# Patient Record
Sex: Male | Born: 1960 | Race: White | Hispanic: No | Smoking: Current every day smoker
Health system: Southern US, Community
[De-identification: ages and names within clinical notes are randomized; demographics above are authoritative.]

## PROBLEM LIST (undated history)

## (undated) DIAGNOSIS — N529 Male erectile dysfunction, unspecified: Secondary | ICD-10-CM

## (undated) DIAGNOSIS — D696 Thrombocytopenia, unspecified: Secondary | ICD-10-CM

## (undated) DIAGNOSIS — F411 Generalized anxiety disorder: Secondary | ICD-10-CM

## (undated) DIAGNOSIS — K862 Cyst of pancreas: Principal | ICD-10-CM

## (undated) DIAGNOSIS — I10 Essential (primary) hypertension: Secondary | ICD-10-CM

## (undated) DIAGNOSIS — E785 Hyperlipidemia, unspecified: Secondary | ICD-10-CM

## (undated) DIAGNOSIS — E039 Hypothyroidism, unspecified: Secondary | ICD-10-CM

## (undated) DIAGNOSIS — K859 Acute pancreatitis without necrosis or infection, unspecified: Secondary | ICD-10-CM

## (undated) HISTORY — DX: Hypothyroidism, unspecified: E03.9

## (undated) HISTORY — DX: Thrombocytopenia, unspecified: D69.6

## (undated) HISTORY — DX: Male erectile dysfunction, unspecified: N52.9

## (undated) HISTORY — DX: Cyst of pancreas: K86.2

## (undated) HISTORY — DX: Essential (primary) hypertension: I10

## (undated) HISTORY — DX: Acute pancreatitis without necrosis or infection, unspecified: K85.90

## (undated) HISTORY — DX: Generalized anxiety disorder: F41.1

## (undated) HISTORY — DX: Hyperlipidemia, unspecified: E78.5

## (undated) HISTORY — PX: ESOPHAGOGASTRODUODENOSCOPY: SHX1529

---

## 1996-05-28 HISTORY — PX: OTHER SURGICAL HISTORY: SHX169

## 2004-08-21 ENCOUNTER — Encounter (INDEPENDENT_AMBULATORY_CARE_PROVIDER_SITE_OTHER): Payer: Self-pay | Admitting: *Deleted

## 2004-08-21 ENCOUNTER — Ambulatory Visit (HOSPITAL_COMMUNITY): Admission: RE | Admit: 2004-08-21 | Discharge: 2004-08-21 | Payer: Self-pay | Admitting: Gastroenterology

## 2006-05-28 DIAGNOSIS — K859 Acute pancreatitis without necrosis or infection, unspecified: Secondary | ICD-10-CM

## 2006-05-28 HISTORY — DX: Acute pancreatitis without necrosis or infection, unspecified: K85.90

## 2006-12-23 ENCOUNTER — Inpatient Hospital Stay (HOSPITAL_COMMUNITY): Admission: EM | Admit: 2006-12-23 | Discharge: 2006-12-26 | Payer: Self-pay | Admitting: Emergency Medicine

## 2008-05-07 ENCOUNTER — Ambulatory Visit: Payer: Self-pay | Admitting: Internal Medicine

## 2008-05-07 DIAGNOSIS — F411 Generalized anxiety disorder: Secondary | ICD-10-CM | POA: Insufficient documentation

## 2008-05-07 DIAGNOSIS — E785 Hyperlipidemia, unspecified: Secondary | ICD-10-CM

## 2008-05-07 DIAGNOSIS — E039 Hypothyroidism, unspecified: Secondary | ICD-10-CM | POA: Insufficient documentation

## 2008-05-07 HISTORY — DX: Generalized anxiety disorder: F41.1

## 2008-05-07 HISTORY — DX: Hyperlipidemia, unspecified: E78.5

## 2008-05-07 HISTORY — DX: Hypothyroidism, unspecified: E03.9

## 2008-08-05 ENCOUNTER — Encounter: Admission: RE | Admit: 2008-08-05 | Discharge: 2008-08-05 | Payer: Self-pay | Admitting: Gastroenterology

## 2008-08-09 ENCOUNTER — Ambulatory Visit: Payer: Self-pay | Admitting: Internal Medicine

## 2008-08-10 LAB — CONVERTED CEMR LAB
Alkaline Phosphatase: 60 units/L (ref 39–117)
Basophils Absolute: 0.1 10*3/uL (ref 0.0–0.1)
Bilirubin, Direct: 0.2 mg/dL (ref 0.0–0.3)
CO2: 27 meq/L (ref 19–32)
Calcium: 9.3 mg/dL (ref 8.4–10.5)
GFR calc Af Amer: 116 mL/min
HDL: 23.6 mg/dL — ABNORMAL LOW (ref >39.0)
Hemoglobin, Urine: NEGATIVE
Hemoglobin: 15.2 g/dL (ref 13.0–17.0)
Hgb A1c MFr Bld: 5.1 % (ref 4.6–6.5)
LDL Cholesterol: 96 mg/dL (ref 0–99)
Leukocytes, UA: NEGATIVE
Lymphocytes Relative: 14.9 % (ref 12.0–46.0)
MCHC: 36 g/dL (ref 30.0–36.0)
Monocytes Absolute: 0.3 10*3/uL (ref 0.1–1.0)
Neutro Abs: 4.7 10*3/uL (ref 1.4–7.7)
Nitrite: NEGATIVE
Platelets: 101 10*3/uL — ABNORMAL LOW (ref 150–400)
Potassium: 3.5 meq/L (ref 3.5–5.1)
RDW: 11.5 % (ref 11.5–14.6)
Sodium: 138 meq/L (ref 135–145)
TSH: 2.89 microintl units/mL (ref 0.35–5.50)
Total Bilirubin: 1.1 mg/dL (ref 0.3–1.2)
Total CHOL/HDL Ratio: 6.6
Triglycerides: 181 mg/dL — ABNORMAL HIGH (ref 0–149)
VLDL: 36 mg/dL (ref 0–40)
Vitamin B-12: >1500 pg/mL — ABNORMAL HIGH (ref 211–911)
pH: 6 (ref 5.0–8.0)

## 2008-09-27 ENCOUNTER — Telehealth (INDEPENDENT_AMBULATORY_CARE_PROVIDER_SITE_OTHER): Payer: Self-pay | Admitting: *Deleted

## 2009-02-15 ENCOUNTER — Telehealth: Payer: Self-pay | Admitting: Internal Medicine

## 2009-06-10 ENCOUNTER — Telehealth (INDEPENDENT_AMBULATORY_CARE_PROVIDER_SITE_OTHER): Payer: Self-pay | Admitting: *Deleted

## 2009-11-04 ENCOUNTER — Telehealth: Payer: Self-pay | Admitting: Internal Medicine

## 2009-11-10 ENCOUNTER — Telehealth: Payer: Self-pay | Admitting: Internal Medicine

## 2009-11-25 ENCOUNTER — Ambulatory Visit: Payer: Self-pay | Admitting: Internal Medicine

## 2010-05-28 HISTORY — PX: COLONOSCOPY: SHX174

## 2010-06-27 NOTE — Assessment & Plan Note (Signed)
Summary: FU  D/T  STC   Vital Signs:  Patient profile:   50 year old male Height:      66 inches Weight:      150.50 pounds BMI:     24.38 O2 Sat:      97 % on Room air Temp:     98.4 degrees F oral Pulse rate:   88 / minute BP sitting:   112 / 80  (left arm) Cuff size:   regular  Vitals Entered By: Zella Ball Ewing CMA (AAMA) (November 25, 2009 1:47 PM)  O2 Flow:  Room air  CC: followup/RE   CC:  followup/RE.  History of Present Illness: overall doing well,  no specific complaints;  denies hyper or hypo thyroid symptoms;  Pt denies CP, sob, doe, wheezing, orthopnea, pnd, worsening LE edema, palps, dizziness or syncope  Pt denies new neuro symptoms such as headache, facial or extremity weakness  Still smoking and not really thinking about quitting.    Preventive Screening-Counseling & Management      Drug Use:  no.    Problems Prior to Update: 1)  Preventive Health Care  (ICD-V70.0) 2)  Hyperlipidemia  (ICD-272.4) 3)  Hypothyroidism  (ICD-244.9) 4)  Anxiety  (ICD-300.00)  Medications Prior to Update: 1)  Metoprolol Tartrate 25 Mg Tabs (Metoprolol Tartrate) .... 1/2 -1 By Mouth Daily As Needed 2)  Clonazepam 1 Mg Tabs (Clonazepam) .... 1/2 - 1 By Mouth Two Times A Day As Needed --  Need Return Office Visit For Further Refills 3)  Armour Thyroid 30 Mg Tabs (Thyroid) .Marland Kitchen.. 1 By Mouth Daily in The Am - Needs Office Visit For Further Refills  Current Medications (verified): 1)  Metoprolol Tartrate 25 Mg Tabs (Metoprolol Tartrate) .... 1/2 -1 By Mouth Daily As Needed 2)  Clonazepam 1 Mg Tabs (Clonazepam) .... 1/2 - 1 By Mouth Two Times A Day As Needed 3)  Levothyroxine Sodium 50 Mcg Tabs (Levothyroxine Sodium) .Marland Kitchen.. 1 By Mouth Once Daily  Allergies (verified): 1)  ! 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         all otherwise negative per pt -    Physical Exam  General:  alert and well-developed.   Head:  normocephalic and atraumatic.   Eyes:  vision grossly intact, pupils equal, and pupils round.   Ears:  R ear normal and L ear normal.   Nose:  no external deformity and no nasal discharge.   Mouth:  no gingival abnormalities and pharynx pink and moist.   Neck:  supple and no masses.   Lungs:  normal respiratory effort and normal breath sounds.  Heart:  normal rate and regular rhythm.   Abdomen:  soft, non-tender, and normal bowel sounds.   Msk:  no joint tenderness and no joint swelling.   Extremities:  no edema, no erythema  Neurologic:  cranial nerves II-XII intact and strength normal in all extremities.   Skin:  color normal and no rashes.   Psych:  good eye contact, not anxious appearing, and not depressed appearing.     Impression & Recommendations:  Problem # 1:  Preventive Health Care (ICD-V70.0) Overall doing well, age appropriate education and counseling updated and referral  for appropriate preventive services done unless declined, immunizations up to date or declined, diet counseling done if overweight, urged to quit smoking if smokes , most recent labs reviewed and current ordered if appropriate, ecg reviewed or declined (interpretation per ECG scanned in the EMR if done); information regarding Medicare Prevention requirements given if appropriate; speciality referrals updated as appropriate   Problem # 2:  HYPOTHYROIDISM (ICD-244.9)  His updated medication list for this problem includes:    Levothyroxine Sodium 50 Mcg Tabs (Levothyroxine sodium) .Marland Kitchen... 1 by mouth once daily treat as above, f/u any worsening signs or symptoms , to change meds as armour thyroid is $28/mo  Problem # 3:  HYPERLIPIDEMIA (ICD-272.4)  Labs Reviewed: SGOT: 40 (08/09/2008)   SGPT: 47 (08/09/2008)   HDL:23.6 (08/09/2008)  LDL:96 (08/09/2008)  Chol:156 (08/09/2008)  Trig:181 (08/09/2008) d/w pt - urged to follow low chol diet,  o/w stable overall by hx and exam, ok to continue meds/tx as is   Complete Medication List: 1)  Metoprolol Tartrate 25 Mg Tabs (Metoprolol tartrate) .... 1/2 -1 by mouth daily as needed 2)  Clonazepam 1 Mg Tabs (Clonazepam) .... 1/2 - 1 by mouth two times a day as needed 3)  Levothyroxine Sodium 50 Mcg Tabs (Levothyroxine sodium) .Marland Kitchen.. 1 by mouth once daily  Patient Instructions: 1)  please stop smoking 2)  stop the armour thyroid when the current bottle is done 3)  start the new less expensive thyroid medication after that 4)  return for LAB only oin 2 months:   CPX labs   v70.0 5)  Continue all previous medications as before this visit  6)  Please schedule a follow-up appointment in 1 year or sooner if needed Prescriptions: METOPROLOL TARTRATE 25 MG TABS (METOPROLOL TARTRATE) 1/2 -1 by mouth daily as needed  #90 x 3   Entered and Authorized by:   Corwin Levins MD   Signed by:   Corwin Levins MD on 11/25/2009   Method used:   Print then Give to Patient    RxID:   [id number] CLONAZEPAM 1 MG TABS (CLONAZEPAM) 1/2 - 1 by mouth two times a day as needed  #60 x 2   Entered and Authorized by:   Corwin Levins MD   Signed by:   Corwin Levins MD on 11/25/2009   Method used:   Print then Give to Patient   RxID:   [id number] LEVOTHYROXINE SODIUM 50 MCG TABS (LEVOTHYROXINE SODIUM) 1 by mouth once daily  #90 x 3   Entered and Authorized by:   Corwin Levins MD   Signed by:   Corwin Levins MD on 11/25/2009   Method used:   Print then Give to Patient   RxID:   [id number]

## 2010-06-27 NOTE — Progress Notes (Signed)
Summary: REFILL?  Phone Note Refill Request   Refills Requested: Medication #1:  ARMOUR THYROID 30 MG TABS 1 by mouth daily in the am. Initial call taken by: Lamar Sprinkles, CMA,  November 04, 2009 5:22 PM  Follow-up for Phone Call        ok, but this is last refill   needs OV with CPX labs  - v70.0 Follow-up by: Corwin Levins MD,  November 04, 2009 5:31 PM  Additional Follow-up for Phone Call Additional follow up Details #1::        Pt's wife informed, states pt has appt scheduled for 7/01 for CPX with labs. Rx in cabinet for pt pick up Additional Follow-up by: Margaret Pyle, CMA,  November 07, 2009 9:46 AM    New/Updated Medications: ARMOUR THYROID 30 MG TABS (THYROID) 1 by mouth daily in the am - needs office visit for further refills Prescriptions: ARMOUR THYROID 30 MG TABS (THYROID) 1 by mouth daily in the am - needs office visit for further refills  #30 x 0   Entered and Authorized by:   Corwin Levins MD   Signed by:   Corwin Levins MD on 11/04/2009   Method used:   Print then Give to Patient   RxID:   [id number]

## 2010-06-27 NOTE — Progress Notes (Signed)
Summary: Med.   Phone Note From Pharmacy   Caller: [facility] Summary of Call: [facility] states pt. has been on the compounded Thyroid usp Capsules 30mg , is it ok to stay with that or change to Armour Thyroid? [facility] Phone#[phone] Fax# [phone] Initial call taken by: Scharlene Gloss,  November 10, 2009 1:35 PM  Follow-up for Phone Call        ok to stay on current med - to robin to handle Follow-up by: Corwin Levins MD,  November 10, 2009 1:51 PM  Additional Follow-up for Phone Call Additional follow up Details #1::        Informed pharmacy MD declines change Additional Follow-up by: Scharlene Gloss,  November 10, 2009 2:22 PM

## 2010-06-27 NOTE — Progress Notes (Signed)
Summary: med refill  Phone Note Refill Request  on June 10, 2009 11:48 AM  Refills Requested: Medication #1:  CLONAZEPAM 1 MG TABS 1/2 - 1 by mouth two times a day as needed   Dosage confirmed as above?Dosage Confirmed   Notes: Algis Liming [place] [phone] Initial call taken by: Scharlene Gloss,  June 10, 2009 11:48 AM  Follow-up for Phone Call        done hardcopy to [facility] side B - dahlia  Follow-up by: Corwin Levins MD,  June 10, 2009 1:05 PM  Additional Follow-up for Phone Call Additional follow up Details #1::        Faxed to [phone] Additional Follow-up by: Josph Macho CMA,  June 10, 2009 1:49 PM    New/Updated Medications: CLONAZEPAM 1 MG TABS (CLONAZEPAM) 1/2 - 1 by mouth two times a day as needed --  need return office visit for further refills Prescriptions: CLONAZEPAM 1 MG TABS (CLONAZEPAM) 1/2 - 1 by mouth two times a day as needed --  need return office visit for further refills  #60 x 1   Entered and Authorized by:   Corwin Levins MD   Signed by:   Corwin Levins MD on 06/10/2009   Method used:   Print then Give to Patient   RxID:   [id number]

## 2010-07-08 IMAGING — US US ABDOMEN COMPLETE
1 series · 13 of 25 positions shown · non-contrast
Comparison: [HOSPITAL] abdominal pelvic CT 12/23/2006.

CLINICAL DATA: Generalized abdominal pain, indigestion, elevated
iron levels.  History pancreatitis and fatty liver.

COMPLETE ABDOMINAL ULTRASOUND

[Series 1: us abdomen complete · 0.31mm/px · 13 of 86 slices shown]
[im 1/86]
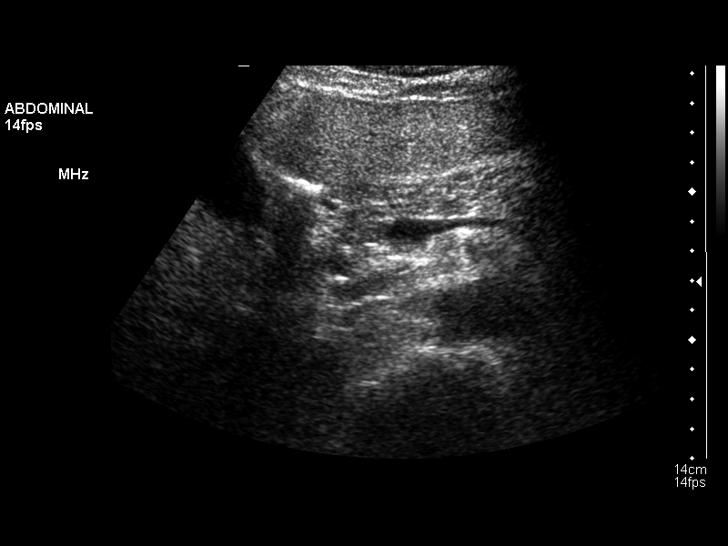
[im 8/86]
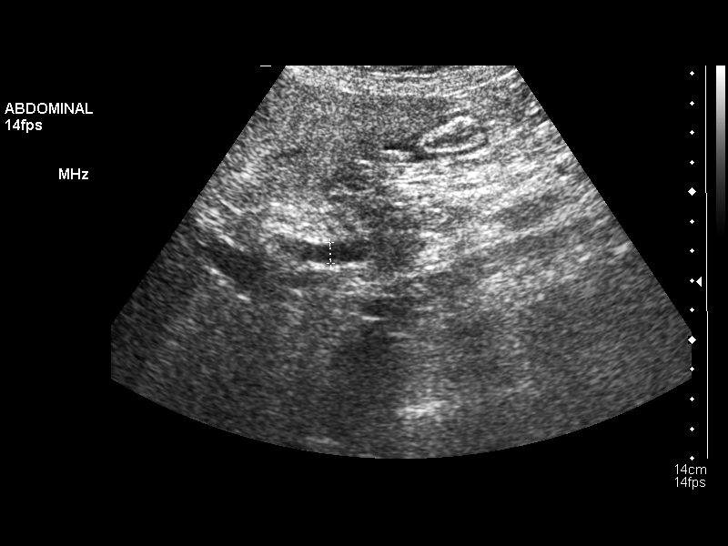
[im 15/86]
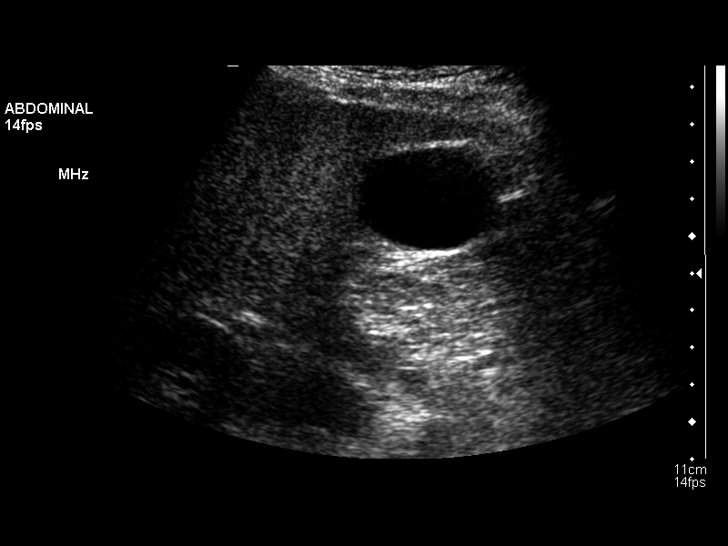
[im 22/86]
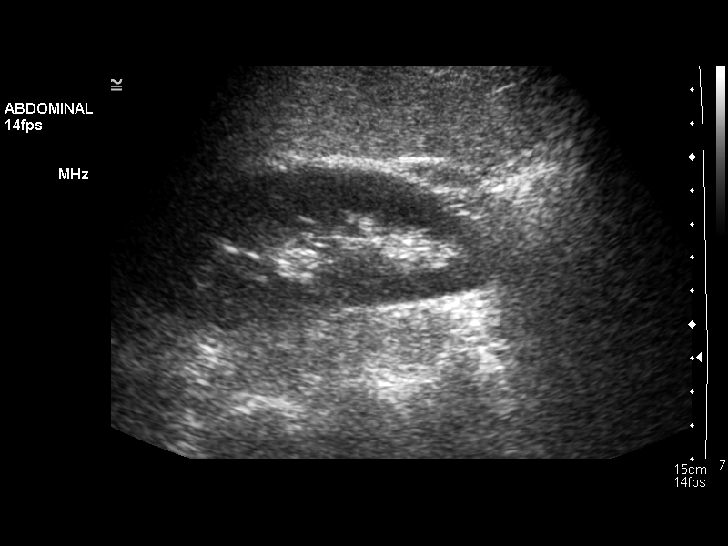
[im 29/86]
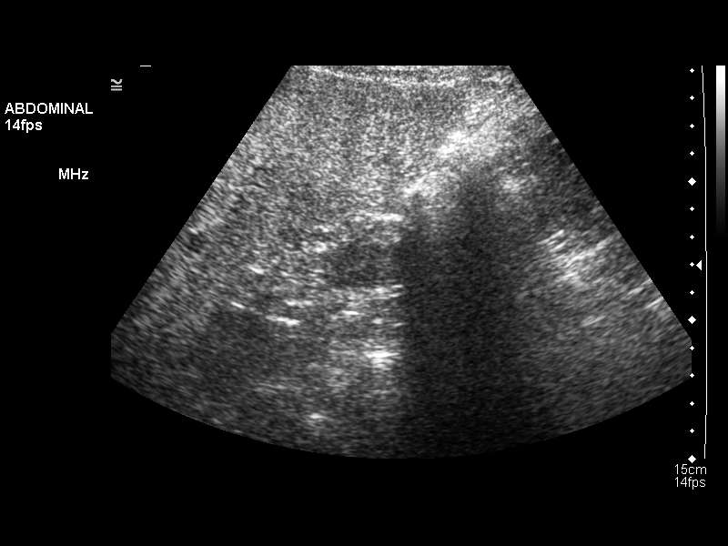
[im 36/86]
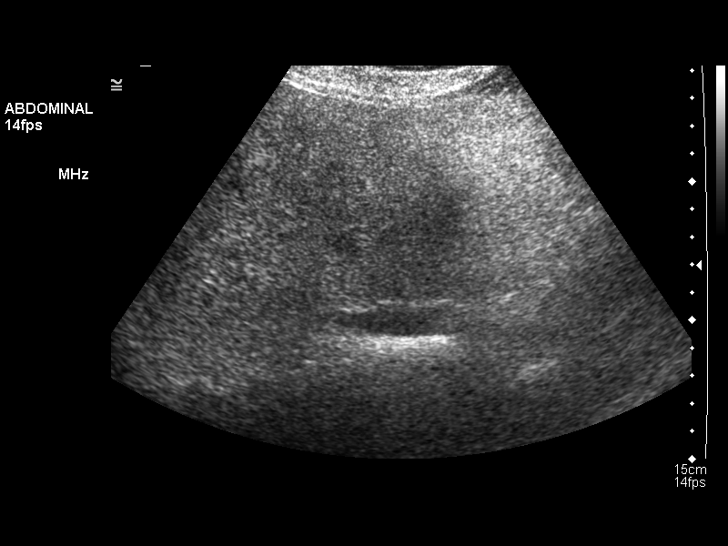
[im 43/86]
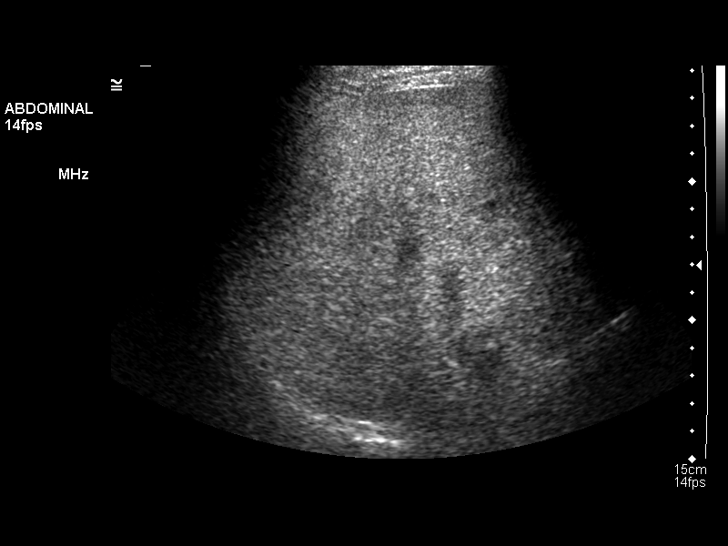
[im 50/86]
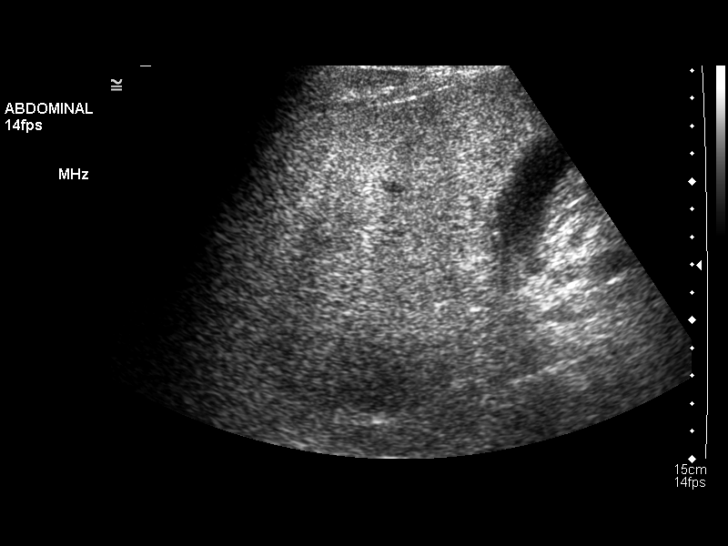
[im 57/86]
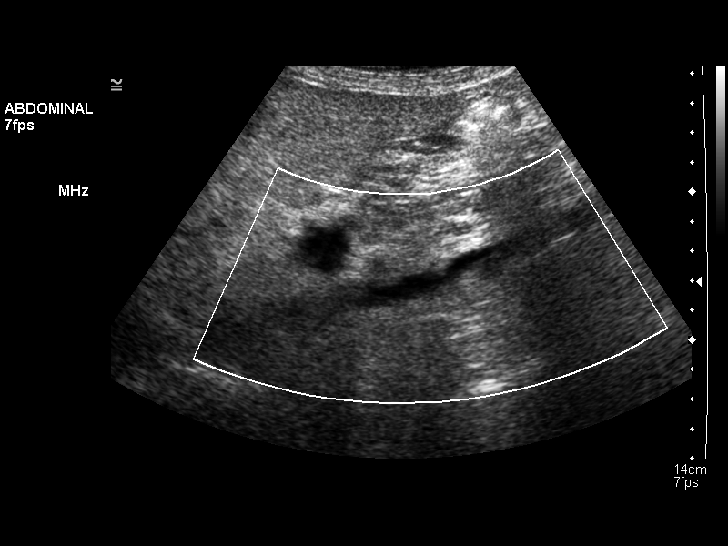
[im 64/86]
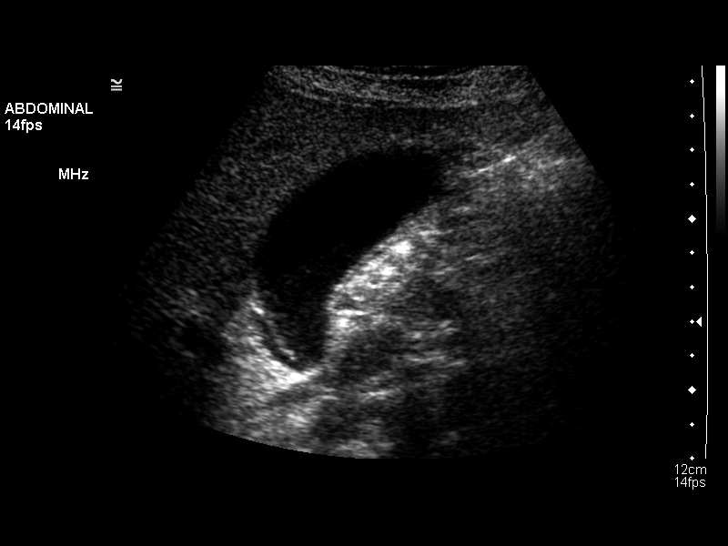
[im 71/86]
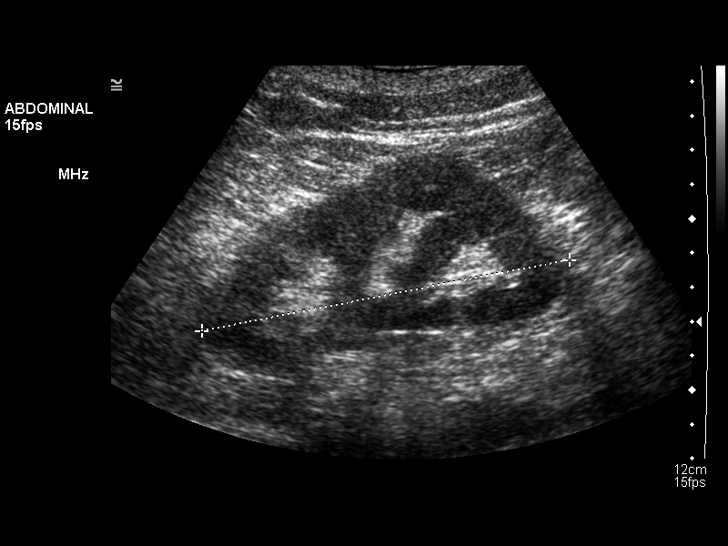
[im 78/86]
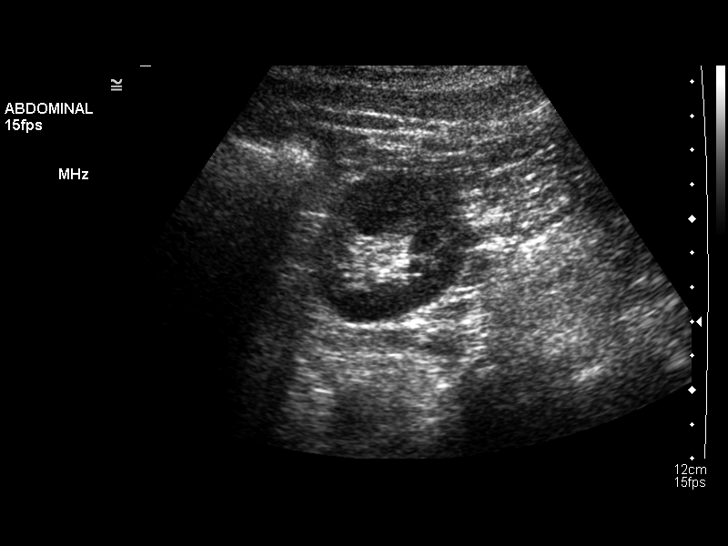
[im 86/86]
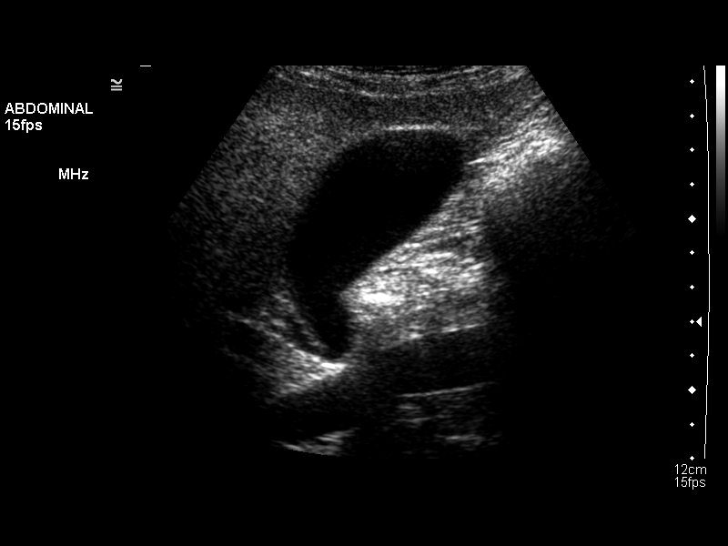

[13 of 25 positions shown; findings below may reference images not displayed]

FINDINGS: Gallbladder:  Normal without sludge or gallstones with 2 mm
wall thickness.  Negative Murphy's sign.

      Common bile duct:  5 mm proximal and 7 mm distal with no
dilated intrahepatic bile ducts.

      Liver:  Stable slight diffuse fatty infiltration liver
findings with normal sized liver and no focal lesion.

      IVC:  Visualized as normal.

      Pancreas:  Heterogeneous pancreatic echotexture with normal
size and no focal lesion.  Tail of pancreas not visualized due to
overlying gastrointestinal gas.

      Spleen:  6.1 cm long and appears normal.

      Right Kidney:  11.4 cm long and appears normal.

      Left Kidney:  10.9 cm long and appears normal.

      Abdominal aorta:  Maximum visualized diameter normal at
cm.

      No free fluid.
IMPRESSION: 1.  Stable slight diffuse fatty infiltration liver.
2. Distal common bile duct upper limits of normal in size with no
obstructed biliary tract otherwise seen.
3.  Heterogeneous pancreatic echotexture consistent with minimal
chronic pancreatitis/post prior acute pancreatitis with no
complicating features (nonvisualization tail of pancreas).
4.  Otherwise, negative.

## 2010-09-06 ENCOUNTER — Other Ambulatory Visit: Payer: Self-pay

## 2010-09-06 DIAGNOSIS — F419 Anxiety disorder, unspecified: Secondary | ICD-10-CM

## 2010-09-06 MED ORDER — CLONAZEPAM 1 MG PO TABS: ORAL_TABLET | ORAL | Status: DC

## 2010-09-06 NOTE — Telephone Encounter (Signed)
Faxed hardcopy to pharmacy. 

## 2010-09-06 NOTE — Telephone Encounter (Signed)
Pharmacy requesting refill on Clonazepam 1 mg. Last refill 03/08/10 #60 with 2 refills and last OV 11/25/09.

## 2010-10-10 NOTE — H&P (Signed)
NAME:  Shawn Peters, Shawn Peters NO.:  [id number]   MEDICAL RECORD NO.:  [id number]          PATIENT TYPE:  EMS   LOCATION:  ED                           FACILITY:  [facility]   PHYSICIAN:  Michaelyn Barter, M.D. DATE OF BIRTH:  February 15, 1961   DATE OF ADMISSION:  12/23/2006  DATE OF DISCHARGE:                              HISTORY & PHYSICAL   PRIMARY CARE DOCTOR:  Dr. Monico Hoar of [facility].   CHIEF COMPLAINT:  Abdominal pain.   HISTORY OF PRESENT ILLNESS:  Shawn Peters is a 50 year old gentleman  who states that at approximately 2 p.m. yesterday afternoon, he  developed stomach cramps and abdominal pain.  He indicated that his  abdomen developed a bloating sensation.  To alleviate his symptoms, he  took a suppository followed by a Fleet Enema; however, he had little  symptom relief.  He has never had any similar pain before.  The pain was  described as diffuse and across his entire abdomen.  He indicates that  there are 2 different sensations; one is an abdominal pain which he  describes as being constant and the second sensation is a cramping-like  sensation which he describes as occurring off and on.  He complains of  emesis, indicating that he has experienced at least 3-4 episodes.  He  denies having any fevers or chills.   PAST MEDICAL HISTORY:  No illnesses.   PAST SURGICAL HISTORY:  Left finger surgery.   ALLERGIES:  PENICILLIN causes rash and/or hives.   HOME MEDICATIONS:  Clonazepam for sleep.   SOCIAL HISTORY:  Alcohol:  The patient started drinking at the age of  73; he drinks 5-6 glasses of bourbon per day every day; the last time he  had a drink was 1-1/2 days ago.  Cigarettes:  The patient started  smoking at the age of 82; he smokes 1 pack of cigarettes per day.  Cocaine:  The last time the patient used cocaine was 10-15 years ago.   FAMILY HISTORY:  Mother had diabetes mellitus and lung cancer.  Father  had a heart problem.   PHYSICAL EXAMINATION:  GENERAL:  The patient is awake.  He is in no  obvious distress.  He is cooperative.  VITALS:  His temperature is 98.5, blood pressure 135/95, heart rate 76,  respirations 20 and O2 SAT 98% on room air.  HEENT:  Atraumatic, anicteric.  Extraocular movements are intact.  Oral  mucosa is pink.  NECK:  No JVD.  No lymphadenopathy.  Supple.  No thyromegaly.  CARDIAC:  S1 and S2 present.  Regular rate and rhythm.  No murmurs, no  gallops, no rubs.  RESPIRATORY:  No crackles or wheezes.  ABDOMEN:  Flat, soft.  Positive tenderness across the upper region of  the patient's abdomen superior to the umbilicus and lateral to the  umbilicus on the right side.  Bowel sounds are present.  No masses.  No  rebound.  No guarding.  The patient indicates that he was recently given  pain medication.  EXTREMITIES:  No leg edema.  NEUROLOGICAL:  The patient is alert and oriented x3.  MUSCULOSKELETAL:  Upper and lower extremities strength 5/5.   LABORATORY AND ACCESSORY CLINICAL DATA:  The patient had a CAT scan  which revealed acute pancreatitis with retroperitoneal edema and an ill-  defined fluid, fatty liver, bibasilar atelectasis, small amount of  pelvic ascites, normal bowel gas pattern.   Lipase 995, sodium 144, potassium 3.9, chloride 106, CO2 25, glucose  141, BUN 5, creatinine 0.71, bilirubin total 2.4, alkaline phosphatase  60, SGOT 65, SGPT 56, total protein 7.3, albumin 4.1, calcium 9.5.  White blood cell count 10.6, hemoglobin 16.8, hematocrit 48, platelet  count 97,000.  Urinalysis negative.   ASSESSMENT AND PLAN:  1. Acute pancreatitis:  The most likely precipitating factor for this      is ongoing alcohol consumption/abuse.  We will make the patient      nothing-by-mouth for now.  We will start aggressive intravenous      fluid hydration and provide the patient with as-needed pain      medication.  2. Alcohol abuse:  We will start the patient on alcohol withdrawal       protocol using Librium.  We will provide the patient with thiamine,      folic acid and multivitamin.  3. Nausea/vomiting:  We will provide the patient with as-needed      antiemetics.  4. Slightly elevated liver enzymes:  This may be mild hepatitis      related to alcohol abuse.  We will monitor this closely for now.  5. Gastrointestinal prophylaxis:  We will provide Protonix.  6. Deep venous thrombosis prophylaxis:  We will provide Lovenox.      Michaelyn Barter, M.D.  Electronically Signed     OR/MEDQ  D:  12/23/2006  T:  12/23/2006  Job:  [id number]   cc:   Shawn Peters, M.D.  Fax: [phone]

## 2010-10-10 NOTE — Discharge Summary (Signed)
NAME:  Shawn Peters, Shawn Peters            ACCOUNT NO.:  [id number]   MEDICAL RECORD NO.:  [id number]          PATIENT TYPE:  INP   LOCATION:  1335                         FACILITY:  [facility]   PHYSICIAN:  Wilson Singer, M.D.DATE OF BIRTH:  [birth year]   DATE OF ADMISSION:  12/22/2006  DATE OF DISCHARGE:                               DISCHARGE SUMMARY   PRIMARY CARE PHYSICIAN:  Dr. Monico Hoar.   FINAL DISCHARGE DIAGNOSES:  1. Acute pancreatitis.  2. Alcohol abuse.  3. Possible hypothyroidism.   CONDITION ON DISCHARGE:  Stable.   DISCHARGE MEDICATIONS:  None except his home medications which include  clonazepam and multivitamin.   HISTORY:  This 50 year old man was admitted with abdominal pain and some  nausea and vomiting.  Please see initial history and physical  examination by [name].   HOSPITAL PROGRESS:  The patient had acute pancreatitis by chemistry with  a lipase of 995, and also a CT abdominal scan.  There were no  complicating features of his acute pancreatitis and in particular there  was no evidence of gallstones or any bile duct dilatation.  It was felt  that his pancreatitis was related to alcohol abuse.  During his  hospitalization, he did not withdraw from alcohol as he does have a  history of drinking excessive alcohol.  He did well with fluids and then  this proceeded to a full diet and he had no problems keeping food down  and his abdominal pain resolved.   PHYSICAL EXAMINATION:  GENERAL:  Today, he is doing well.  VITAL SIGNS:  Temperature 98.3, blood pressure 129/89, pulse 78,  saturation 98% on room air.  ABDOMEN:  Soft and nontender.   INVESTIGATIONS:  Today, show a hemoglobin of 13.6, white blood cell  count 5.8, platelets 81.  Sodium 137, potassium 3.5, bicarbonate 27, BUN  1, creatinine 0.62.  TSH is elevated at 6.737.   FINAL DISPOSITION:  He does not wish at this present time to seek help  on his alcoholism.  We have offered this to  him.  Also, his TSH needs to  be repeated as it appears he has hypothyroidism.  He does have  macrocytosis but his  folate and B12 levels are within normal limits and overall his  macrocytosis is felt to be due to his alcoholism as well as accounting  for the thrombocytopenia that was seen which actually has been improving  since hospitalization.  I have asked him to follow up with his primary  care physician, [name], in the next 1-2 weeks.      Wilson Singer, M.D.  Electronically Signed     NCG/MEDQ  D:  12/26/2006  T:  12/26/2006  Job:  [id number]   cc:   Dr. Monico Hoar

## 2010-10-13 NOTE — Op Note (Signed)
NAME:  Shawn Peters, GOLDEN NO.:  [id number]   MEDICAL RECORD NO.:  [id number]          PATIENT TYPE:  AMB   LOCATION:  ENDO                         FACILITY:  [facility]   PHYSICIAN:  Graylin Shiver, M.D.   DATE OF BIRTH:  1960-11-03   DATE OF PROCEDURE:  08/21/2004  DATE OF DISCHARGE:                                 OPERATIVE REPORT   PROCEDURE:  Esophagogastroduodenoscopy with biopsy.   INDICATIONS:  Indigestion, recent hematemesis.   Informed consent was obtained after explanation of the risks of bleeding,  infection and perforation.   PREMEDICATIONS:  1.  Fentanyl 100 mcg IV.  2.  Versed 10 mg IV.   PROCEDURE:  With the patient in the left lateral decubitus position, the  Olympus gastroscope was inserted into the oropharynx and passed into the  esophagus.  It was advanced down the esophagus, then into the stomach, and  into the duodenum.  The second portion and bulb of the duodenum looked  normal.  The stomach showed erythema compatible with gastritis.  Biopsy was  obtained from the distal stomach for histology and to look for evidence of  Helicobacter pylori.  No lesions were seen in the fundus or cardia.  The  esophagus looked normal.  The esophagogastric junction was at 38 cm.  He  tolerated the procedure well without complications.   IMPRESSION:  Gastritis.   PLAN:  The biopsies will be checked.  The patient will currently remain on  Prilosec over-the-counter.  If Helicobacter pylori is present, it will be  treated.      SFG/MEDQ  D:  08/21/2004  T:  08/21/2004  Job:  [id number]   cc:   Monico Hoar, MD

## 2010-11-23 ENCOUNTER — Other Ambulatory Visit: Payer: Self-pay

## 2010-11-23 MED ORDER — LEVOTHYROXINE SODIUM 50 MCG PO TABS: 50.0000 ug | ORAL_TABLET | Freq: Every day | ORAL | Status: DC

## 2010-11-24 ENCOUNTER — Other Ambulatory Visit (INDEPENDENT_AMBULATORY_CARE_PROVIDER_SITE_OTHER): Payer: Self-pay

## 2010-11-24 DIAGNOSIS — Z0389 Encounter for observation for other suspected diseases and conditions ruled out: Secondary | ICD-10-CM

## 2010-11-24 DIAGNOSIS — Z0000 Encounter for general adult medical examination without abnormal findings: Secondary | ICD-10-CM

## 2010-11-24 LAB — CBC WITH DIFFERENTIAL/PLATELET
Basophils Relative: 0.4 % (ref 0.0–3.0)
Eosinophils Absolute: 0.3 10*3/uL (ref 0.0–0.7)
Eosinophils Relative: 5.6 % — ABNORMAL HIGH (ref 0.0–5.0)
Lymphocytes Relative: 22.7 % (ref 12.0–46.0)
Monocytes Absolute: 0.5 10*3/uL (ref 0.1–1.0)
Neutrophils Relative %: 63.8 % (ref 43.0–77.0)
Platelets: 112 10*3/uL — ABNORMAL LOW (ref 150.0–400.0)
RBC: 4.09 Mil/uL — ABNORMAL LOW (ref 4.22–5.81)
WBC: 6.2 10*3/uL (ref 4.5–10.5)

## 2010-11-24 LAB — URINALYSIS
Ketones, ur: NEGATIVE
Specific Gravity, Urine: 1.01 (ref 1.000–1.030)
Total Protein, Urine: NEGATIVE
Urine Glucose: NEGATIVE
Urobilinogen, UA: 0.2 (ref 0.0–1.0)

## 2010-11-24 LAB — LIPID PANEL
Cholesterol: 110 mg/dL (ref 0–200)
HDL: 29.9 mg/dL — ABNORMAL LOW (ref >39.00)
LDL Cholesterol: 63 mg/dL (ref 0–99)
Total CHOL/HDL Ratio: 4
Triglycerides: 86 mg/dL (ref 0.0–149.0)

## 2010-11-24 LAB — BASIC METABOLIC PANEL
Calcium: 8.9 mg/dL (ref 8.4–10.5)
GFR: 138.05 mL/min (ref >60.00)
Sodium: 139 mEq/L (ref 135–145)

## 2010-11-24 LAB — HEPATIC FUNCTION PANEL
ALT: 40 U/L (ref 0–53)
AST: 47 U/L — ABNORMAL HIGH (ref 0–37)
Albumin: 3.8 g/dL (ref 3.5–5.2)
Bilirubin, Direct: 0.4 mg/dL — ABNORMAL HIGH (ref 0.0–0.3)
Total Bilirubin: 1.5 mg/dL — ABNORMAL HIGH (ref 0.3–1.2)
Total Protein: 6.9 g/dL (ref 6.0–8.3)

## 2010-11-24 LAB — PSA: PSA: 0.38 ng/mL (ref 0.10–4.00)

## 2010-11-29 ENCOUNTER — Encounter: Payer: Self-pay | Admitting: Internal Medicine

## 2010-11-29 DIAGNOSIS — Z0001 Encounter for general adult medical examination with abnormal findings: Secondary | ICD-10-CM | POA: Insufficient documentation

## 2010-11-30 ENCOUNTER — Encounter: Payer: Self-pay | Admitting: Internal Medicine

## 2010-11-30 ENCOUNTER — Other Ambulatory Visit (INDEPENDENT_AMBULATORY_CARE_PROVIDER_SITE_OTHER): Payer: PRIVATE HEALTH INSURANCE

## 2010-11-30 ENCOUNTER — Ambulatory Visit (INDEPENDENT_AMBULATORY_CARE_PROVIDER_SITE_OTHER): Payer: PRIVATE HEALTH INSURANCE | Admitting: Internal Medicine

## 2010-11-30 VITALS — BP 106/74 | HR 98 | Temp 98.4°F | Ht 66.0 in | Wt 140.2 lb

## 2010-11-30 DIAGNOSIS — D7589 Other specified diseases of blood and blood-forming organs: Secondary | ICD-10-CM

## 2010-11-30 DIAGNOSIS — N529 Male erectile dysfunction, unspecified: Secondary | ICD-10-CM

## 2010-11-30 DIAGNOSIS — Z0000 Encounter for general adult medical examination without abnormal findings: Secondary | ICD-10-CM

## 2010-11-30 DIAGNOSIS — F3289 Other specified depressive episodes: Secondary | ICD-10-CM

## 2010-11-30 DIAGNOSIS — R7401 Elevation of levels of liver transaminase levels: Secondary | ICD-10-CM

## 2010-11-30 DIAGNOSIS — F32A Depression, unspecified: Secondary | ICD-10-CM | POA: Insufficient documentation

## 2010-11-30 DIAGNOSIS — F411 Generalized anxiety disorder: Secondary | ICD-10-CM

## 2010-11-30 DIAGNOSIS — F329 Major depressive disorder, single episode, unspecified: Secondary | ICD-10-CM

## 2010-11-30 DIAGNOSIS — R7402 Elevation of levels of lactic acid dehydrogenase (LDH): Secondary | ICD-10-CM

## 2010-11-30 DIAGNOSIS — F419 Anxiety disorder, unspecified: Secondary | ICD-10-CM

## 2010-11-30 DIAGNOSIS — Z23 Encounter for immunization: Secondary | ICD-10-CM

## 2010-11-30 HISTORY — DX: Male erectile dysfunction, unspecified: N52.9

## 2010-11-30 LAB — VITAMIN B12: Vitamin B-12: >1500 pg/mL — ABNORMAL HIGH (ref 211–911)

## 2010-11-30 LAB — FOLATE: Folate: 10.3 ng/mL (ref >5.9)

## 2010-11-30 MED ORDER — METOPROLOL TARTRATE 25 MG PO TABS: ORAL_TABLET | ORAL | Status: DC

## 2010-11-30 MED ORDER — FLUOXETINE HCL 20 MG PO CAPS: 20.0000 mg | ORAL_CAPSULE | Freq: Every day | ORAL | Status: DC

## 2010-11-30 MED ORDER — VARDENAFIL HCL 20 MG PO TABS: 20.0000 mg | ORAL_TABLET | ORAL | Status: DC | PRN

## 2010-11-30 MED ORDER — CLONAZEPAM 1 MG PO TABS: ORAL_TABLET | ORAL | Status: DC

## 2010-11-30 MED ORDER — TETANUS-DIPHTH-ACELL PERTUSSIS 5-2.5-18.5 LF-MCG/0.5 IM SUSP: 0.5000 mL | Freq: Once | INTRAMUSCULAR | Status: DC

## 2010-11-30 MED ORDER — VARDENAFIL HCL 20 MG PO TABS: 20.0000 mg | ORAL_TABLET | ORAL | Status: AC | PRN

## 2010-11-30 MED ORDER — LEVOTHYROXINE SODIUM 50 MCG PO TABS: 50.0000 ug | ORAL_TABLET | Freq: Every day | ORAL | Status: DC

## 2010-11-30 NOTE — Assessment & Plan Note (Signed)
Mild to mod, currently divorcing, declines counseling, for prozac 20 mg qd, verified nonsuicidal,  to f/u any worsening symptoms or concerns

## 2010-11-30 NOTE — Assessment & Plan Note (Signed)

## 2010-11-30 NOTE — Assessment & Plan Note (Signed)
Mild, to check testosterone level, for levitra prn ,  to f/u any worsening symptoms or concerns

## 2010-11-30 NOTE — Patient Instructions (Signed)
You had the tetanus shot today You will be contacted regarding the referral for: colonoscopy Please stop smoking Please stop drinking Alcohol if possible Please go to LAB in the Basement for the blood and/or urine tests to be done today You will be contacted regarding the referral for: abdomen ultrasound to check the liver Please call the phone number [phone] (the PhoneTree System) for results of testing in 2-3 days;  When calling, simply dial the number, and when prompted enter the MRN number above (the Medical Record Number) and the # key, then the message should start. Take all new medications as prescribed - the levitra, and prozac  Please return in 6 months, or sooner if needed

## 2010-11-30 NOTE — Assessment & Plan Note (Addendum)
Mild at best, but for abd u/s - ? Cirrhosis, check hep C as well

## 2010-11-30 NOTE — Progress Notes (Signed)
Quick Note:  Voice message left on PhoneTree system - lab is negative, normal or otherwise stable, pt to continue same tx ______ 

## 2010-11-30 NOTE — Progress Notes (Signed)
Subjective:    Patient ID: Shawn Peters, male    DOB: 1960/08/24, 50 y.o.   MRN: [id number]  HPI  Here for wellness and f/u;  Overall doing ok;  Pt denies CP, worsening SOB, DOE, wheezing, orthopnea, PND, worsening LE edema, palpitations, dizziness or syncope.  Pt denies neurological change such as new Headache, facial or extremity weakness.  Pt denies polydipsia, polyuria, or low sugar symptoms. Pt states overall good compliance with treatment and medications, good tolerability, and trying to follow lower cholesterol diet.  Pt denies worsening depressive symptoms, suicidal ideation or panic. No fever, wt loss, night sweats, loss of appetite, or other constitutional symptoms.  Pt states good ability with ADL's, low fall risk, home safety reviewed and adequate, no significant changes in hearing or vision, and occasionally active with exercise. Still smoking, and ETOH but denies excess.  Currently 6 mo separated and plans to divorce.  Has worsening depressive symptoms x 2 mo, without suicidal ideation, or panic, though has ongoing anxiety, not increased recently.  Past Medical History  Diagnosis Date  . ANXIETY 05/07/2008  . HYPERLIPIDEMIA 05/07/2008  . HYPOTHYROIDISM 05/07/2008  . Erectile dysfunction 11/30/2010   Past Surgical History  Procedure Date  . S/p left fourth finger tendon surgury 1998    reports that he has been smoking.  He does not have any smokeless tobacco history on file. He reports that he drinks alcohol. He reports that he does not use illicit drugs. family history includes Cancer in his other; Diabetes in his other; and Heart disease in his other. Allergies  Allergen Reactions  . Penicillins    Current Outpatient Prescriptions on File Prior to Visit  Medication Sig Dispense Refill  . DISCONTD: clonazePAM (KLONOPIN) 1 MG tablet 1/2 - 1 twice daily as needed  60 tablet  2  . DISCONTD: levothyroxine (SYNTHROID, LEVOTHROID) 50 MCG tablet Take 1 tablet (50 mcg total) by mouth  daily.  90 tablet  0  . DISCONTD: metoprolol tartrate (LOPRESSOR) 25 MG tablet 1/2 - 1 by mouth daily as needed        No current facility-administered medications on file prior to visit.   Review of Systems Review of Systems  Constitutional: Negative for diaphoresis, activity change, appetite change and unexpected weight change.  HENT: Negative for hearing loss, ear pain, facial swelling, mouth sores and neck stiffness.   Eyes: Negative for pain, redness and visual disturbance.  Respiratory: Negative for shortness of breath and wheezing.   Cardiovascular: Negative for chest pain and palpitations.  Gastrointestinal: Negative for diarrhea, blood in stool, abdominal distention and rectal pain.  Genitourinary: Negative for hematuria, flank pain and decreased urine volume.  Musculoskeletal: Negative for myalgias and joint swelling.  Skin: Negative for color change and wound.  Neurological: Negative for syncope and numbness.  Hematological: Negative for adenopathy.  Psychiatric/Behavioral: Negative for hallucinations, self-injury, decreased concentration and agitation.      Objective:   Physical Exam BP 106/74  Pulse 98  Temp(Src) 98.4 F (36.9 C) (Oral)  Ht 5\' 6"  (1.676 m)  Wt 140 lb 4 oz (63.617 kg)  BMI 22.64 kg/m2  SpO2 98% Physical Exam  VS noted Constitutional: Pt is oriented to person, place, and time. Appears well-developed and well-nourished.  HENT:  Head: Normocephalic and atraumatic.  Right Ear: External ear normal.  Left Ear: External ear normal.  Nose: Nose normal.  Mouth/Throat: Oropharynx is clear and moist. pharynx benign Eyes: Conjunctivae and EOM are normal. Pupils are equal, round, and reactive to  light.  Neck: Normal range of motion. Neck supple. No JVD present. No tracheal deviation present.  Cardiovascular: Normal rate, regular rhythm, normal heart sounds and intact distal pulses.   Pulmonary/Chest: Effort normal and breath sounds normal.  Abdominal: Soft.  Bowel sounds are normal. There is no tenderness.  Musculoskeletal: Normal range of motion. Exhibits no edema.  Lymphadenopathy:  Has no cervical adenopathy.  Neurological: Pt is alert and oriented to person, place, and time. Pt has normal reflexes. No cranial nerve deficit.  Skin: Skin is warm and dry. No rash noted.  Psychiatric:  Has dysphoric mood and affect. Behavior is normal.  1+ nervous         Assessment & Plan:

## 2010-11-30 NOTE — Assessment & Plan Note (Signed)
Quite severe, will check b12/folate, doubt med related, pt denies "excessive" ETOH, but suspect may have underlying liver dz

## 2010-12-01 ENCOUNTER — Other Ambulatory Visit: Payer: Self-pay | Admitting: Internal Medicine

## 2010-12-01 DIAGNOSIS — R768 Other specified abnormal immunological findings in serum: Secondary | ICD-10-CM | POA: Insufficient documentation

## 2010-12-01 LAB — TESTOSTERONE, FREE, TOTAL, SHBG: Sex Hormone Binding: 41 nmol/L (ref 13–71)

## 2010-12-01 LAB — HEPATITIS PANEL, ACUTE
HCV Ab: REACTIVE — AB
Hep A IgM: NEGATIVE
Hep B C IgM: NEGATIVE
Hepatitis B Surface Ag: NEGATIVE

## 2010-12-01 NOTE — Progress Notes (Signed)
Quick Note:  Voice message left on PhoneTree system - lab is negative, normal or otherwise stable, pt to continue same tx ______ 

## 2010-12-07 ENCOUNTER — Other Ambulatory Visit: Payer: PRIVATE HEALTH INSURANCE

## 2010-12-07 ENCOUNTER — Encounter: Payer: Self-pay | Admitting: Gastroenterology

## 2010-12-07 DIAGNOSIS — R768 Other specified abnormal immunological findings in serum: Secondary | ICD-10-CM

## 2010-12-14 ENCOUNTER — Encounter: Payer: PRIVATE HEALTH INSURANCE | Admitting: Cardiology

## 2010-12-15 ENCOUNTER — Encounter: Payer: Self-pay | Admitting: Internal Medicine

## 2010-12-15 ENCOUNTER — Other Ambulatory Visit: Payer: Self-pay | Admitting: Internal Medicine

## 2010-12-15 ENCOUNTER — Ambulatory Visit
Admission: RE | Admit: 2010-12-15 | Discharge: 2010-12-15 | Disposition: A | Discharge status: Home / Self Care | Payer: PRIVATE HEALTH INSURANCE | Source: Ambulatory Visit | Attending: Internal Medicine | Admitting: Internal Medicine

## 2010-12-15 DIAGNOSIS — K862 Cyst of pancreas: Secondary | ICD-10-CM | POA: Insufficient documentation

## 2010-12-15 DIAGNOSIS — R7401 Elevation of levels of liver transaminase levels: Secondary | ICD-10-CM

## 2010-12-15 HISTORY — DX: Cyst of pancreas: K86.2

## 2010-12-18 ENCOUNTER — Telehealth: Payer: Self-pay

## 2010-12-18 NOTE — Telephone Encounter (Signed)
Patient is informed.

## 2010-12-18 NOTE — Telephone Encounter (Signed)
Message copied by Pincus Sanes on Mon Dec 18, 2010 10:40 AM ------      Message from: Corwin Levins      Created: Caleen Essex Dec 15, 2010 12:58 PM       Left message on phone tree  - u/s show enlargement (mildly) of cyst on pancreas from mar 2010;                 1)  Will need to see GI, ? Need further evaluation such as MRCP or ERCP (further testing to make sure not serious such as cancer)            Ermal Haberer to inform pt, I will do referral

## 2010-12-18 NOTE — Telephone Encounter (Signed)
Message copied by Pincus Sanes on Mon Dec 18, 2010 11:44 AM ------      Message from: Corwin Levins      Created: Caleen Essex Dec 15, 2010 12:58 PM       Left message on phone tree  - u/s show enlargement (mildly) of cyst on pancreas from mar 2010;                 1)  Will need to see GI, ? Need further evaluation such as MRCP or ERCP (further testing to make sure not serious such as cancer)            Robin to inform pt, I will do referral

## 2010-12-18 NOTE — Telephone Encounter (Signed)
Called patient; left message to call back.

## 2011-01-15 ENCOUNTER — Ambulatory Visit (INDEPENDENT_AMBULATORY_CARE_PROVIDER_SITE_OTHER): Payer: PRIVATE HEALTH INSURANCE | Admitting: Gastroenterology

## 2011-01-15 ENCOUNTER — Encounter: Payer: Self-pay | Admitting: Gastroenterology

## 2011-01-15 VITALS — BP 104/84 | HR 80 | Ht 66.0 in | Wt 149.0 lb

## 2011-01-15 DIAGNOSIS — Z1211 Encounter for screening for malignant neoplasm of colon: Secondary | ICD-10-CM

## 2011-01-15 DIAGNOSIS — R933 Abnormal findings on diagnostic imaging of other parts of digestive tract: Secondary | ICD-10-CM

## 2011-01-15 NOTE — Patient Instructions (Addendum)
You have been scheduled for a Colonoscopy.  See separate instructions.  You have been scheduled for a CT scan of the abdomen and pelvis at [facility] ([address]---this is in the same building as [facility]).   You are scheduled on 01/17/11 at 3:00 pm. You should arrive 30 minutes prior to your appointment time for registration. Please follow the written instructions below on the day of your exam:  1) Do not eat or drink anything after 11:00am (4 hours prior to your test)  You may take any medications as prescribed with a small amount of water except for the following: Metformin, Glucophage, Glucovance, Avandamet, Riomet, Fortamet, Actoplus Met, Janumet, Glumetza or Metaglip. The above medications must be held the day of the exam and 48 hours after the exam.  The purpose of you drinking the oral contrast is to aid in the visualization of your intestinal tract. The contrast solution may cause some diarrhea. Before your exam is started, you will be given a small amount of fluid to drink. Depending on your individual set of symptoms, you may also receive an intravenous injection of x-ray contrast/dye.  If you have any questions regarding your exam or if you need to reschedule, you may call the CT department at [phone] between the hours of 8:00 am and 5:00 pm, Monday-Friday.  cc: Oliver Barre, MD

## 2011-01-15 NOTE — Progress Notes (Signed)
History of Present Illness: This is a 50 year old white male referred for further evaluation of a pancreatitis cyst. Patient has a history of acute pancreatitis in 2008 felt secondary to alcohol. He states he currently drinks 4-6 drinks a per weekend night on a regular basis, He was recently found to have positive hepatitis C antibody however RNA testing was negative. Abdominal ultrasound was performed in 11/2010 showing: 1. 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 In light of prior pancreatitis, interval pseudocyst favored  in differential diagnosis - cannot exclude primary pancreatic  cystic neoplasm. Recommend follow-up pancreatic ultrasound in 6-12  months, pancreatic protocol abdominal CT pre and post contrast or  MRI or endoscopic transgastric ultrasound guided pancreatic cyst  aspiration as clinically indicated.  2. Stable slight diffuse fatty infiltration liver.  3. Otherwise, normal. He denies abdominal pain, nausea, vomiting, weight loss, change in bowel habits, diarrhea, constipation, melena, hematochezia. He has rare episodes of reflux occurring about once every month and the symptoms respond to Prilosec. He underwent upper endoscopy 6 by Dr.Ganem with findings as chronic gastritis.  Past Medical History  Diagnosis Date  . ANXIETY 05/07/2008  . HYPERLIPIDEMIA 05/07/2008  . HYPOTHYROIDISM 05/07/2008  . Erectile dysfunction 11/30/2010  . 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 Acute pancreatitis 2008   Past Surgical History  Procedure Date  . S/p left fourth finger tendon surgury 1998    reports that he has been smoking.  He has never used smokeless tobacco. He reports that he drinks alcohol. He reports that he does not use illicit drugs. family history includes Cancer in his other; Diabetes in his other; and Heart disease in his other. Allergies  Allergen Reactions  . Penicillins    Outpatient  Encounter Prescriptions as of 01/15/2011  Medication Sig Dispense Refill  . clonazePAM (KLONOPIN) 1 MG tablet 1/2 - 1 twice daily as needed  60 tablet  2  . FLUoxetine (PROZAC) 20 MG capsule Take 1 capsule (20 mg total) by mouth daily.  90 capsule  3  . levothyroxine (SYNTHROID, LEVOTHROID) 50 MCG tablet Take 1 tablet (50 mcg total) by mouth daily.  90 tablet  3  . metoprolol tartrate (LOPRESSOR) 25 MG tablet 1/2 - 1 by mouth daily as needed  90 tablet  3   Facility-Administered Encounter Medications as of 01/15/2011  Medication Dose Route Frequency Provider Last Rate Last Dose  . TDaP (BOOSTRIX) injection 0.5 mL  0.5 mL Intramuscular Once Oliver Barre, MD       Review of Systems: Pertinent positive and negative review of systems were noted in the above HPI section. All other review of systems were otherwise negative.  Physical Exam: General: Well developed , well nourished, no acute distress Head: Normocephalic and atraumatic Eyes:  sclerae anicteric, EOMI Ears: Normal auditory acuity Mouth: No deformity or lesions Neck: Supple, no masses or thyromegaly Lungs: Clear throughout to auscultation Heart: Regular rate and rhythm; no murmurs, rubs or bruits Abdomen: Soft, non tender and non distended. No masses, hepatosplenomegaly or hernias noted. Normal Bowel sounds Rectal: Deferred to colonoscopy Musculoskeletal: Symmetrical with no gross deformities  Skin: No lesions on visible extremities Pulses:  Normal pulses noted Extremities: No clubbing, cyanosis, edema or deformities noted Neurological: Alert oriented x 4, grossly nonfocal Cervical Nodes:  No significant cervical adenopathy Inguinal Nodes: No significant inguinal adenopathy Psychological:  Alert and cooperative. Normal mood and affect  Assessment and Recommendations:  1. 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 There is no clear history of a recent episode of pancreatitis however I suspect this is the most  likely etiology for his cyst. Further evaluation with a pancreatic protocol CT scan. Consider followup imaging studies to document resolution of the cyst. If the cyst persists, consider endoscopic ultrasound with fluid aspiration. He is advised to discontinue, or at least substantially decrease, alcohol consumption.  2. Colorectal cancer screening. Average risk. Schedule colonoscopy. The risks, benefits, and alternatives to colonoscopy with possible biopsy and possible polypectomy were discussed with the patient and they consent to proceed.   3. GERD. Infrequent symptoms. Standard antireflux measures and Prilosec 20 mg daily as needed.  4. Hepatitis C antibody positive with a negative HCV RNA

## 2011-01-16 ENCOUNTER — Encounter: Payer: Self-pay | Admitting: Gastroenterology

## 2011-01-26 ENCOUNTER — Ambulatory Visit (INDEPENDENT_AMBULATORY_CARE_PROVIDER_SITE_OTHER)
Admission: RE | Admit: 2011-01-26 | Discharge: 2011-01-26 | Disposition: A | Discharge status: Home / Self Care | Payer: PRIVATE HEALTH INSURANCE | Source: Ambulatory Visit | Attending: Gastroenterology | Admitting: Gastroenterology

## 2011-01-26 DIAGNOSIS — Z1211 Encounter for screening for malignant neoplasm of colon: Secondary | ICD-10-CM

## 2011-01-26 DIAGNOSIS — R933 Abnormal findings on diagnostic imaging of other parts of digestive tract: Secondary | ICD-10-CM

## 2011-01-26 MED ORDER — IOHEXOL 300 MG/ML  SOLN
100.0000 mL | Freq: Once | INTRAMUSCULAR | Status: AC | PRN
  Administered 2011-01-26: 100 mL via INTRAVENOUS

## 2011-02-02 ENCOUNTER — Encounter: Payer: Self-pay | Admitting: Gastroenterology

## 2011-02-02 ENCOUNTER — Ambulatory Visit (AMBULATORY_SURGERY_CENTER): Payer: PRIVATE HEALTH INSURANCE | Admitting: Gastroenterology

## 2011-02-02 DIAGNOSIS — Z1211 Encounter for screening for malignant neoplasm of colon: Secondary | ICD-10-CM

## 2011-02-02 DIAGNOSIS — R933 Abnormal findings on diagnostic imaging of other parts of digestive tract: Secondary | ICD-10-CM

## 2011-02-02 MED ORDER — SODIUM CHLORIDE 0.9 % IV SOLN: 500.0000 mL | INTRAVENOUS | Status: DC

## 2011-02-02 NOTE — Patient Instructions (Signed)
MILD DIVERTICULOSIS  HIGH FIBER DIET  REPEAT COLONOSCOPY IN 10 YEARS  SEE GREEN AND BLUE SHEETS FOR ADDITIONAL D/C INSTRUCTIONS.

## 2011-02-02 NOTE — Progress Notes (Signed)
Pt was difficult to sedate.  Med orders were ordered by Dr. Russella Dar.  Benadryl was also ordered.  Procedure was started with the pt opening his eyes off and on.  Pt was very relaxed and rested comfortably with his eyes closed when coming out of the cecum.  He tolerated the colon exam very well. maw

## 2011-02-05 ENCOUNTER — Telehealth: Payer: Self-pay

## 2011-02-05 NOTE — Telephone Encounter (Signed)
No ID on answering machine. 

## 2011-03-12 LAB — CBC
HCT: 38.6 — ABNORMAL LOW
HCT: 41.6
HCT: 48
Hemoglobin: 12.6 — ABNORMAL LOW
Hemoglobin: 13
Hemoglobin: 14.8
Hemoglobin: 16.8
MCHC: 35.3
MCHC: 35.4
MCV: 111 — ABNORMAL HIGH
MCV: 111.7 — ABNORMAL HIGH
MCV: 112.4 — ABNORMAL HIGH
MCV: 112.9 — ABNORMAL HIGH
MCV: 112.9 — ABNORMAL HIGH
Platelets: 61 — ABNORMAL LOW
Platelets: 97 — ABNORMAL LOW
RBC: 3.17 — ABNORMAL LOW
RBC: 3.3 — ABNORMAL LOW
RBC: 3.42 — ABNORMAL LOW
RBC: 4.25
WBC: 10.6 — ABNORMAL HIGH
WBC: 5.9
WBC: 7.4
WBC: 9.8

## 2011-03-12 LAB — COMPREHENSIVE METABOLIC PANEL
ALT: 26
AST: 28
AST: 32
Albumin: 2.9 — ABNORMAL LOW
Albumin: 3.5
Alkaline Phosphatase: 48
Alkaline Phosphatase: 51
Alkaline Phosphatase: 60
BUN: 1 — ABNORMAL LOW
BUN: 4 — ABNORMAL LOW
BUN: 5 — ABNORMAL LOW
CO2: 25
CO2: 26
CO2: 27
CO2: 27
CO2: 27
Calcium: 7.5 — ABNORMAL LOW
Calcium: 8.8
Chloride: 100
Chloride: 106
Chloride: 99
Creatinine, Ser: 0.62
Creatinine, Ser: 0.66
Creatinine, Ser: 0.71
Creatinine, Ser: 0.75
GFR calc Af Amer: >60
GFR calc Af Amer: >60
GFR calc Af Amer: >60
GFR calc non Af Amer: >60
GFR calc non Af Amer: >60
GFR calc non Af Amer: >60
GFR calc non Af Amer: >60
GFR calc non Af Amer: >60
Glucose, Bld: 106 — ABNORMAL HIGH
Glucose, Bld: 111 — ABNORMAL HIGH
Glucose, Bld: 112 — ABNORMAL HIGH
Glucose, Bld: 141 — ABNORMAL HIGH
Potassium: 3.2 — ABNORMAL LOW
Potassium: 3.9
Potassium: 4
Sodium: 134 — ABNORMAL LOW
Sodium: 137
Total Bilirubin: 2.2 — ABNORMAL HIGH
Total Bilirubin: 2.4 — ABNORMAL HIGH
Total Bilirubin: 2.4 — ABNORMAL HIGH
Total Protein: 6.1

## 2011-03-12 LAB — URINALYSIS, ROUTINE W REFLEX MICROSCOPIC
Glucose, UA: NEGATIVE
Hgb urine dipstick: NEGATIVE
Protein, ur: NEGATIVE
pH: 7.5

## 2011-03-12 LAB — LIPASE, BLOOD
Lipase: 106 — ABNORMAL HIGH
Lipase: 140 — ABNORMAL HIGH
Lipase: 995 — ABNORMAL HIGH

## 2011-03-12 LAB — DIFFERENTIAL
Basophils Absolute: 0
Basophils Relative: 0
Lymphocytes Relative: 5 — ABNORMAL LOW
Monocytes Absolute: 0.7
Neutro Abs: 9.5 — ABNORMAL HIGH

## 2011-03-12 LAB — AMYLASE: Amylase: 615 — ABNORMAL HIGH

## 2011-03-12 LAB — VITAMIN B12: Vitamin B-12: 1833 — ABNORMAL HIGH (ref 211–911)

## 2011-03-13 ENCOUNTER — Other Ambulatory Visit: Payer: Self-pay | Admitting: *Deleted

## 2011-03-13 DIAGNOSIS — F419 Anxiety disorder, unspecified: Secondary | ICD-10-CM

## 2011-03-13 MED ORDER — METOPROLOL TARTRATE 25 MG PO TABS: ORAL_TABLET | ORAL | Status: DC

## 2011-03-13 MED ORDER — LEVOTHYROXINE SODIUM 50 MCG PO TABS: 50.0000 ug | ORAL_TABLET | Freq: Every day | ORAL | Status: DC

## 2011-03-13 NOTE — Telephone Encounter (Signed)
Pt called requesting refill of Levothyroxine.

## 2011-03-13 NOTE — Telephone Encounter (Signed)
R'cd fax from [facility] for refill of Clonazepam-Last written 11/30/2010 #60 with 2 refills--please advise

## 2011-03-14 MED ORDER — CLONAZEPAM 1 MG PO TABS: ORAL_TABLET | ORAL | Status: DC

## 2011-03-14 NOTE — Telephone Encounter (Signed)
Rx faxed to pharmacy  

## 2011-03-14 NOTE — Telephone Encounter (Signed)
Done hardcopy to dahlia/LIM B  

## 2011-04-26 ENCOUNTER — Telehealth: Payer: Self-pay

## 2011-04-26 NOTE — Telephone Encounter (Signed)
Patient is due for MRCP to follow up CT scan from 01/15/11.  I have left a message for the patient to call back

## 2011-04-30 NOTE — Telephone Encounter (Signed)
Unable to reach the patient by phone.  I have mailed him a letter asking him to call and schedule MRI

## 2011-05-14 ENCOUNTER — Telehealth: Payer: Self-pay | Admitting: Gastroenterology

## 2011-05-14 DIAGNOSIS — K862 Cyst of pancreas: Secondary | ICD-10-CM

## 2011-05-14 NOTE — Telephone Encounter (Signed)
Patient is due for MRCP to follow up pancreatic cyst seen on CT scan from 01/15/11.  Patient is scheduled at [facility] for 1:00 05-31-11 [address].  Patient advised to be 4 hours NPO

## 2011-05-28 ENCOUNTER — Other Ambulatory Visit: Payer: Self-pay | Admitting: Gastroenterology

## 2011-05-28 DIAGNOSIS — Z139 Encounter for screening, unspecified: Secondary | ICD-10-CM

## 2011-05-31 ENCOUNTER — Ambulatory Visit
Admission: RE | Admit: 2011-05-31 | Discharge: 2011-05-31 | Disposition: A | Discharge status: Home / Self Care | Payer: PRIVATE HEALTH INSURANCE | Source: Ambulatory Visit | Attending: Gastroenterology | Admitting: Gastroenterology

## 2011-05-31 DIAGNOSIS — K862 Cyst of pancreas: Secondary | ICD-10-CM

## 2011-05-31 DIAGNOSIS — Z139 Encounter for screening, unspecified: Secondary | ICD-10-CM

## 2011-05-31 MED ORDER — GADOBENATE DIMEGLUMINE 529 MG/ML IV SOLN: 14.0000 mL | Freq: Once | INTRAVENOUS | Status: DC | PRN

## 2011-05-31 MED ORDER — GADOBENATE DIMEGLUMINE 529 MG/ML IV SOLN
13.0000 mL | Freq: Once | INTRAVENOUS | Status: AC | PRN
  Administered 2011-05-31: 13 mL via INTRAVENOUS

## 2011-06-28 ENCOUNTER — Encounter: Payer: Self-pay | Admitting: Internal Medicine

## 2011-06-28 ENCOUNTER — Ambulatory Visit (INDEPENDENT_AMBULATORY_CARE_PROVIDER_SITE_OTHER): Payer: PRIVATE HEALTH INSURANCE | Admitting: Internal Medicine

## 2011-06-28 VITALS — BP 118/82 | HR 74 | Temp 97.3°F | Ht 66.0 in | Wt 143.4 lb

## 2011-06-28 DIAGNOSIS — R768 Other specified abnormal immunological findings in serum: Secondary | ICD-10-CM

## 2011-06-28 DIAGNOSIS — Z0000 Encounter for general adult medical examination without abnormal findings: Secondary | ICD-10-CM

## 2011-06-28 DIAGNOSIS — F329 Major depressive disorder, single episode, unspecified: Secondary | ICD-10-CM

## 2011-06-28 DIAGNOSIS — F32A Depression, unspecified: Secondary | ICD-10-CM

## 2011-06-28 DIAGNOSIS — E039 Hypothyroidism, unspecified: Secondary | ICD-10-CM

## 2011-06-28 DIAGNOSIS — N529 Male erectile dysfunction, unspecified: Secondary | ICD-10-CM

## 2011-06-28 DIAGNOSIS — F17200 Nicotine dependence, unspecified, uncomplicated: Secondary | ICD-10-CM

## 2011-06-28 DIAGNOSIS — F3289 Other specified depressive episodes: Secondary | ICD-10-CM

## 2011-06-28 DIAGNOSIS — R894 Abnormal immunological findings in specimens from other organs, systems and tissues: Secondary | ICD-10-CM

## 2011-06-28 DIAGNOSIS — E785 Hyperlipidemia, unspecified: Secondary | ICD-10-CM

## 2011-06-28 NOTE — Assessment & Plan Note (Signed)
stable overall by hx and exam, most recent data reviewed with pt, and pt to continue medical treatment as before  Lab Results  Component Value Date   LDLCALC 63 11/24/2010

## 2011-06-28 NOTE — Assessment & Plan Note (Signed)
stable overall by hx and exam, most recent data reviewed with pt, and pt to continue medical treatment as before Lab Results  Component Value Date   WBC 6.2 11/24/2010   HGB 16.5 11/24/2010   HCT 46.9 11/24/2010   PLT 112.0* 11/24/2010   GLUCOSE 104* 11/24/2010   CHOL 110 11/24/2010   TRIG 86.0 11/24/2010   HDL 29.90* 11/24/2010   LDLCALC 63 11/24/2010   ALT 40 11/24/2010   AST 47* 11/24/2010   NA 139 11/24/2010   K 4.3 11/24/2010   CL 104 11/24/2010   CREATININE 0.7 11/24/2010   BUN 7 11/24/2010   CO2 29 11/24/2010   TSH 1.37 11/24/2010   PSA 0.38 11/24/2010   HGBA1C 5.1 08/10/2008

## 2011-06-28 NOTE — Patient Instructions (Signed)
No changes in medication today You are given the viagra samples we discussed Continue all other medications as before Please return in 6 mo with Lab testing done 3-5 days before

## 2011-06-28 NOTE — Progress Notes (Signed)
  Subjective:    Patient ID: Shawn Peters, male    DOB: [birth year], 51 y.o.   MRN: [id number]  HPI  Here to f/u; overall doing well. Denies worsening depressive symptoms, suicidal ideation, or panic, though has ongoing anxiety, not increased recently.   Denies hyper or hypo thyroid symptoms such as voice, skin or hair change. Pt denies chest pain, increased sob or doe, wheezing, orthopnea, PND, increased LE swelling, palpitations, dizziness or syncope.  Trying to follow lower chol diet. Pt denies new neurological symptoms such as new headache, or facial or extremity weakness or numbness  Wants to quit smoking - requests chantix.   Pt denies fever, wt loss, night sweats, loss of appetite, or other constitutional symptoms  Overall good compliance with treatment, and good medicine tolerability. Past Medical History  Diagnosis Date  . ANXIETY 05/07/2008  . HYPERLIPIDEMIA 05/07/2008  . HYPOTHYROIDISM 05/07/2008  . Erectile dysfunction 11/30/2010  . 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 Acute pancreatitis 2008   Past Surgical History  Procedure Date  . S/p left fourth finger tendon surgury 1998    reports that he has been smoking.  He has never used smokeless tobacco. He reports that he drinks about 12.6 ounces of alcohol per week. He reports that he does not use illicit drugs. family history includes Cancer in his other; Diabetes in his other; and Heart disease in his other. Allergies  Allergen Reactions  . Penicillins    Current Outpatient Prescriptions on File Prior to Visit  Medication Sig Dispense Refill  . clonazePAM (KLONOPIN) 1 MG tablet 1/2 - 1 twice daily as needed  60 tablet  2  . levothyroxine (SYNTHROID, LEVOTHROID) 50 MCG tablet Take 1 tablet (50 mcg total) by mouth daily.  90 tablet  2  . metoprolol tartrate (LOPRESSOR) 25 MG tablet 1/2 - 1 by mouth daily as needed  90 tablet  2  . FLUoxetine (PROZAC) 20 MG capsule Take 1 capsule (20 mg total) by mouth daily.  90 capsule  3   Current  Facility-Administered Medications on File Prior to Visit  Medication Dose Route Frequency Provider Last Rate Last Dose  . TDaP (BOOSTRIX) injection 0.5 mL  0.5 mL Intramuscular Once Oliver Barre, MD       Review of Systems Review of Systems  Constitutional: Negative for diaphoresis and unexpected weight change.  HENT: Negative for drooling and tinnitus.   Eyes: Negative for photophobia and visual disturbance.  Respiratory: Negative for choking and stridor.   Gastrointestinal: Negative for vomiting and blood in stool.  Genitourinary: Negative for hematuria and decreased urine volume.      Objective:   Physical Exam BP 118/82  Pulse 74  Temp(Src) 97.3 F (36.3 C) (Oral)  Ht 5\' 6"  (1.676 m)  Wt 143 lb 6 oz (65.034 kg)  BMI 23.14 kg/m2  SpO2 97% Physical Exam  VS noted Constitutional: Pt appears well-developed and well-nourished.  HENT: Head: Normocephalic.  Right Ear: External ear normal.  Left Ear: External ear normal.  Eyes: Conjunctivae and EOM are normal. Pupils are equal, round, and reactive to light.  Neck: Normal range of motion. Neck supple.  Cardiovascular: Normal rate and regular rhythm.   Pulmonary/Chest: Effort normal and breath sounds normal.  Neurological: Pt is alert. No cranial nerve deficit.  Skin: Skin is warm. No erythema.  Psychiatric: Pt behavior is normal. Thought content normal. 1+ nervous, not depressed appearing    Assessment & Plan:

## 2011-06-28 NOTE — Assessment & Plan Note (Addendum)
I offered chantix,  to f/u any worsening symptoms or concerns

## 2011-06-28 NOTE — Assessment & Plan Note (Signed)
stable overall by hx and exam, most recent data reviewed with pt, and pt to continue medical treatment as before  Lab Results  Component Value Date   TSH 1.37 11/24/2010

## 2011-06-28 NOTE — Assessment & Plan Note (Signed)
Also gave several viagra samples as were avilable today, o/w stable

## 2011-06-28 NOTE — Assessment & Plan Note (Signed)
Hep c RNA neg, no further eval or tx needed

## 2011-08-31 ENCOUNTER — Telehealth: Payer: Self-pay

## 2011-08-31 DIAGNOSIS — K863 Pseudocyst of pancreas: Secondary | ICD-10-CM

## 2011-08-31 NOTE — Telephone Encounter (Signed)
Message copied by Annett Fabian on Fri Aug 31, 2011  2:47 PM ------      Message from: Annett Fabian      Created: Fri Jun 01, 2011 10:40 AM                   ----- Message -----         From: Rossie Muskrat, RN,CGRN         Sent: 06/01/2011  10:40 AM           To: Rossie Muskrat, RN,CGRN            Needs follow up CT see CT from January

## 2011-08-31 NOTE — Telephone Encounter (Signed)
I have left a message for the patient to call back to schedule a follow up CT with pancreatic protocol.

## 2011-09-03 NOTE — Telephone Encounter (Signed)
Patient is scheduled for CT 09/20/11 2:00 [employer] office.  He is scheduled for pancreatic protocol and will drink water and not get the oral contrast.  He needs to arrive at 1:30 to begin drinking water at the [facility] st office.  I have mailed him instructions.  He is asked to call back for any questions or concerns.

## 2011-09-03 NOTE — Telephone Encounter (Signed)
I have left a message for the patient to call me back 

## 2011-09-20 ENCOUNTER — Other Ambulatory Visit: Payer: PRIVATE HEALTH INSURANCE

## 2011-09-20 ENCOUNTER — Other Ambulatory Visit: Payer: Self-pay

## 2011-09-20 DIAGNOSIS — F419 Anxiety disorder, unspecified: Secondary | ICD-10-CM

## 2011-09-20 MED ORDER — CLONAZEPAM 1 MG PO TABS: ORAL_TABLET | ORAL | Status: DC

## 2011-09-20 NOTE — Telephone Encounter (Signed)
Done hardcopy to robin  

## 2011-09-21 NOTE — Telephone Encounter (Signed)
Faxed hardcopy to pharmacy. 

## 2011-12-03 ENCOUNTER — Other Ambulatory Visit: Payer: Self-pay

## 2011-12-03 MED ORDER — LEVOTHYROXINE SODIUM 50 MCG PO TABS: 50.0000 ug | ORAL_TABLET | Freq: Every day | ORAL | Status: DC

## 2011-12-26 ENCOUNTER — Ambulatory Visit: Payer: PRIVATE HEALTH INSURANCE | Admitting: Internal Medicine

## 2012-01-03 ENCOUNTER — Other Ambulatory Visit (INDEPENDENT_AMBULATORY_CARE_PROVIDER_SITE_OTHER): Payer: PRIVATE HEALTH INSURANCE

## 2012-01-03 ENCOUNTER — Encounter: Payer: Self-pay | Admitting: Internal Medicine

## 2012-01-03 ENCOUNTER — Ambulatory Visit (INDEPENDENT_AMBULATORY_CARE_PROVIDER_SITE_OTHER): Payer: PRIVATE HEALTH INSURANCE | Admitting: Internal Medicine

## 2012-01-03 VITALS — BP 112/78 | HR 94 | Temp 97.9°F | Ht 66.0 in | Wt 140.4 lb

## 2012-01-03 DIAGNOSIS — F419 Anxiety disorder, unspecified: Secondary | ICD-10-CM

## 2012-01-03 DIAGNOSIS — F411 Generalized anxiety disorder: Secondary | ICD-10-CM

## 2012-01-03 DIAGNOSIS — Z0000 Encounter for general adult medical examination without abnormal findings: Secondary | ICD-10-CM

## 2012-01-03 LAB — PSA: PSA: 0.37 ng/mL (ref 0.10–4.00)

## 2012-01-03 LAB — BASIC METABOLIC PANEL
BUN: 8 mg/dL (ref 6–23)
CO2: 28 mEq/L (ref 19–32)
Calcium: 9.1 mg/dL (ref 8.4–10.5)
GFR: 116.52 mL/min (ref >60.00)
Glucose, Bld: 115 mg/dL — ABNORMAL HIGH (ref 70–99)
Sodium: 138 mEq/L (ref 135–145)

## 2012-01-03 LAB — CBC WITH DIFFERENTIAL/PLATELET
Basophils Relative: 0.5 % (ref 0.0–3.0)
Eosinophils Absolute: 0.2 10*3/uL (ref 0.0–0.7)
Eosinophils Relative: 3.1 % (ref 0.0–5.0)
Hemoglobin: 16.1 g/dL (ref 13.0–17.0)
Lymphocytes Relative: 18.6 % (ref 12.0–46.0)
Monocytes Relative: 7.9 % (ref 3.0–12.0)
Neutro Abs: 4.4 10*3/uL (ref 1.4–7.7)
Neutrophils Relative %: 69.9 % (ref 43.0–77.0)
RBC: 4.06 Mil/uL — ABNORMAL LOW (ref 4.22–5.81)
WBC: 6.3 10*3/uL (ref 4.5–10.5)

## 2012-01-03 LAB — LIPID PANEL
Cholesterol: 127 mg/dL (ref 0–200)
HDL: 36.7 mg/dL — ABNORMAL LOW (ref >39.00)
Triglycerides: 101 mg/dL (ref 0.0–149.0)
VLDL: 20.2 mg/dL (ref 0.0–40.0)

## 2012-01-03 LAB — HEPATIC FUNCTION PANEL
ALT: 46 U/L (ref 0–53)
Albumin: 3.6 g/dL (ref 3.5–5.2)
Bilirubin, Direct: 0.4 mg/dL — ABNORMAL HIGH (ref 0.0–0.3)
Total Protein: 7.1 g/dL (ref 6.0–8.3)

## 2012-01-03 LAB — URINALYSIS, ROUTINE W REFLEX MICROSCOPIC
Hgb urine dipstick: NEGATIVE
Nitrite: POSITIVE
Specific Gravity, Urine: 1.015 (ref 1.000–1.030)
Urine Glucose: NEGATIVE

## 2012-01-03 MED ORDER — ASPIRIN 81 MG PO TBEC: 81.0000 mg | DELAYED_RELEASE_TABLET | Freq: Every day | ORAL | Status: AC

## 2012-01-03 MED ORDER — METOPROLOL TARTRATE 25 MG PO TABS: ORAL_TABLET | ORAL | Status: DC

## 2012-01-03 MED ORDER — CLONAZEPAM 1 MG PO TABS: ORAL_TABLET | ORAL | Status: DC

## 2012-01-03 MED ORDER — LEVOTHYROXINE SODIUM 50 MCG PO TABS: 50.0000 ug | ORAL_TABLET | Freq: Every day | ORAL | Status: DC

## 2012-01-03 NOTE — Patient Instructions (Addendum)
Please start Aspirin 81 mg - 1 per day - Enteric Coated only, to reduce risk of stroke and heart disease Continue all other medications as before No changes needed today Please go to LAB in the Basement for the blood and/or urine tests to be done today You will be contacted by phone if any changes need to be made immediately.  Otherwise, you will receive a letter about your results with an explanation. Please return in 1 year for your yearly visit, or sooner if needed, with Lab testing done 3-5 days before

## 2012-01-03 NOTE — Assessment & Plan Note (Signed)

## 2012-01-03 NOTE — Progress Notes (Signed)
Subjective:    Patient ID: Shawn Peters, male    DOB: 22-Mar-1961, 51 y.o.   MRN: [id number]  HPI  Here for wellness and f/u;  Overall doing ok;  Pt denies CP, worsening SOB, DOE, wheezing, orthopnea, PND, worsening LE edema, palpitations, dizziness or syncope.  Pt denies neurological change such as new Headache, facial or extremity weakness.  Pt denies polydipsia, polyuria, or low sugar symptoms. Pt states overall good compliance with treatment and medications, good tolerability, and trying to follow lower cholesterol diet.  Pt denies worsening depressive symptoms, suicidal ideation or panic. No fever, wt loss, night sweats, loss of appetite, or other constitutional symptoms.  Pt states good ability with ADL's, low fall risk, home safety reviewed and adequate, no significant changes in hearing or vision, and occasionally active with exercise.  Still smoking, not ready to quit.  No acute complaints Past Medical History  Diagnosis Date  . ANXIETY 05/07/2008  . HYPERLIPIDEMIA 05/07/2008  . HYPOTHYROIDISM 05/07/2008  . Erectile dysfunction 11/30/2010  . 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 Acute pancreatitis 2008   Past Surgical History  Procedure Date  . S/p left fourth finger tendon surgury 1998    reports that he has been smoking.  He has never used smokeless tobacco. He reports that he drinks about 12.6 ounces of alcohol per week. He reports that he does not use illicit drugs. family history includes Cancer in his other; Diabetes in his other; and Heart disease in his other. Allergies  Allergen Reactions  . Penicillins    Current Outpatient Prescriptions on File Prior to Visit  Medication Sig Dispense Refill  . DISCONTD: clonazePAM (KLONOPIN) 1 MG tablet 1/2 - 1 twice daily as needed  60 tablet  2  . DISCONTD: levothyroxine (SYNTHROID, LEVOTHROID) 50 MCG tablet Take 1 tablet (50 mcg total) by mouth daily.  90 tablet  2  . DISCONTD: metoprolol tartrate (LOPRESSOR) 25 MG tablet 1/2 - 1 by mouth  daily as needed  90 tablet  2  . FLUoxetine (PROZAC) 20 MG capsule Take 1 capsule (20 mg total) by mouth daily.  90 capsule  3   Review of Systems Review of Systems  Constitutional: Negative for diaphoresis, activity change, appetite change and unexpected weight change.  HENT: Negative for hearing loss, ear pain, facial swelling, mouth sores and neck stiffness.   Eyes: Negative for pain, redness and visual disturbance.  Respiratory: Negative for shortness of breath and wheezing.   Cardiovascular: Negative for chest pain and palpitations.  Gastrointestinal: Negative for diarrhea, blood in stool, abdominal distention and rectal pain.  Genitourinary: Negative for hematuria, flank pain and decreased urine volume.  Musculoskeletal: Negative for myalgias and joint swelling.  Skin: Negative for color change and wound.  Neurological: Negative for syncope and numbness.  Hematological: Negative for adenopathy.  Psychiatric/Behavioral: Negative for hallucinations, self-injury, decreased concentration and agitation.   Objective:   Physical Exam BP 112/78  Pulse 94  Temp 97.9 F (36.6 C) (Oral)  Ht 5\' 6"  (1.676 m)  Wt 140 lb 6 oz (63.674 kg)  BMI 22.66 kg/m2  SpO2 97% Physical Exam  VS noted Constitutional: Pt is oriented to person, place, and time. Appears well-developed and well-nourished.  HENT:  Head: Normocephalic and atraumatic.  Right Ear: External ear normal.  Left Ear: External ear normal.  Nose: Nose normal.  Mouth/Throat: Oropharynx is clear and moist.  Eyes: Conjunctivae and EOM are normal. Pupils are equal, round, and reactive to light.  Neck: Normal range of motion. Neck  supple. No JVD present. No tracheal deviation present.  Cardiovascular: Normal rate, regular rhythm, normal heart sounds and intact distal pulses.   Pulmonary/Chest: Effort normal and breath sounds normal.  Abdominal: Soft. Bowel sounds are normal. There is no tenderness.  Musculoskeletal: Normal range of  motion. Exhibits no edema.  Lymphadenopathy:  Has no cervical adenopathy.  Neurological: Pt is alert and oriented to person, place, and time. Pt has normal reflexes. No cranial nerve deficit.  Skin: Skin is warm and dry. No rash noted.  Psychiatric:  Has 1+ nervous, Behavior is normal.     Assessment & Plan:

## 2012-01-03 NOTE — Assessment & Plan Note (Signed)
stable overall by hx and exam, and pt to continue medical treatment as before 

## 2012-05-30 ENCOUNTER — Other Ambulatory Visit: Payer: Self-pay

## 2012-05-30 MED ORDER — METOPROLOL TARTRATE 25 MG PO TABS: ORAL_TABLET | ORAL | Status: DC

## 2012-07-17 ENCOUNTER — Other Ambulatory Visit: Payer: Self-pay

## 2012-07-17 DIAGNOSIS — F419 Anxiety disorder, unspecified: Secondary | ICD-10-CM

## 2012-07-17 MED ORDER — CLONAZEPAM 1 MG PO TABS: ORAL_TABLET | ORAL | Status: DC

## 2012-07-17 NOTE — Telephone Encounter (Signed)
Done hardcopy to robin  

## 2012-07-17 NOTE — Telephone Encounter (Signed)
Faxed hardcopy to pharmacy. 

## 2012-08-29 ENCOUNTER — Other Ambulatory Visit: Payer: Self-pay | Admitting: Internal Medicine

## 2013-01-16 ENCOUNTER — Other Ambulatory Visit: Payer: Self-pay

## 2013-01-16 DIAGNOSIS — F419 Anxiety disorder, unspecified: Secondary | ICD-10-CM

## 2013-01-16 MED ORDER — CLONAZEPAM 1 MG PO TABS: ORAL_TABLET | ORAL | Status: DC

## 2013-01-16 NOTE — Telephone Encounter (Signed)
Faxed hardcopy to [facility]. 

## 2013-01-16 NOTE — Telephone Encounter (Signed)
Done hardcopy to robin  

## 2013-06-01 ENCOUNTER — Other Ambulatory Visit: Payer: Self-pay | Admitting: Internal Medicine

## 2013-06-09 ENCOUNTER — Other Ambulatory Visit: Payer: Self-pay | Admitting: Internal Medicine

## 2013-06-30 ENCOUNTER — Other Ambulatory Visit (INDEPENDENT_AMBULATORY_CARE_PROVIDER_SITE_OTHER): Payer: BC Managed Care – PPO

## 2013-06-30 ENCOUNTER — Ambulatory Visit (INDEPENDENT_AMBULATORY_CARE_PROVIDER_SITE_OTHER): Payer: BC Managed Care – PPO | Admitting: Internal Medicine

## 2013-06-30 ENCOUNTER — Encounter: Payer: Self-pay | Admitting: Internal Medicine

## 2013-06-30 VITALS — BP 120/74 | HR 104 | Temp 98.6°F | Ht 67.0 in | Wt 135.5 lb

## 2013-06-30 DIAGNOSIS — F411 Generalized anxiety disorder: Secondary | ICD-10-CM

## 2013-06-30 DIAGNOSIS — Z0000 Encounter for general adult medical examination without abnormal findings: Secondary | ICD-10-CM

## 2013-06-30 DIAGNOSIS — R7302 Impaired glucose tolerance (oral): Secondary | ICD-10-CM | POA: Insufficient documentation

## 2013-06-30 DIAGNOSIS — R7309 Other abnormal glucose: Secondary | ICD-10-CM

## 2013-06-30 DIAGNOSIS — D696 Thrombocytopenia, unspecified: Secondary | ICD-10-CM | POA: Insufficient documentation

## 2013-06-30 HISTORY — DX: Thrombocytopenia, unspecified: D69.6

## 2013-06-30 LAB — CBC WITH DIFFERENTIAL/PLATELET
Basophils Absolute: 0 10*3/uL (ref 0.0–0.1)
Basophils Relative: 0.2 % (ref 0.0–3.0)
Eosinophils Absolute: 0.1 10*3/uL (ref 0.0–0.7)
Eosinophils Relative: 1.7 % (ref 0.0–5.0)
HEMATOCRIT: 45.5 % (ref 39.0–52.0)
Hemoglobin: 15.4 g/dL (ref 13.0–17.0)
LYMPHS ABS: 1.4 10*3/uL (ref 0.7–4.0)
Lymphocytes Relative: 18.5 % (ref 12.0–46.0)
MCHC: 33.9 g/dL (ref 30.0–36.0)
MCV: 117.2 fl — ABNORMAL HIGH (ref 78.0–100.0)
MONOS PCT: 7.7 % (ref 3.0–12.0)
Monocytes Absolute: 0.6 10*3/uL (ref 0.1–1.0)
NEUTROS ABS: 5.4 10*3/uL (ref 1.4–7.7)
Neutrophils Relative %: 71.9 % (ref 43.0–77.0)
Platelets: 120 10*3/uL — ABNORMAL LOW (ref 150.0–400.0)
RBC: 3.88 Mil/uL — AB (ref 4.22–5.81)
RDW: 12 % (ref 11.5–14.6)
WBC: 7.5 10*3/uL (ref 4.5–10.5)

## 2013-06-30 LAB — URINALYSIS, ROUTINE W REFLEX MICROSCOPIC
Hgb urine dipstick: NEGATIVE
Leukocytes, UA: NEGATIVE
Nitrite: POSITIVE — AB
PH: 6 (ref 5.0–8.0)
RBC / HPF: NONE SEEN (ref 0–?)
Total Protein, Urine: 30 — AB
URINE GLUCOSE: NEGATIVE
Urobilinogen, UA: 1 (ref 0.0–1.0)

## 2013-06-30 LAB — LIPID PANEL
CHOLESTEROL: 138 mg/dL (ref 0–200)
HDL: 39.7 mg/dL (ref >39.00)
LDL Cholesterol: 81 mg/dL (ref 0–99)
Total CHOL/HDL Ratio: 3
Triglycerides: 89 mg/dL (ref 0.0–149.0)
VLDL: 17.8 mg/dL (ref 0.0–40.0)

## 2013-06-30 LAB — BASIC METABOLIC PANEL
BUN: 6 mg/dL (ref 6–23)
CO2: 28 mEq/L (ref 19–32)
Calcium: 8.8 mg/dL (ref 8.4–10.5)
Chloride: 99 mEq/L (ref 96–112)
Creatinine, Ser: 0.7 mg/dL (ref 0.4–1.5)
GFR: 127.54 mL/min (ref >60.00)
Glucose, Bld: 121 mg/dL — ABNORMAL HIGH (ref 70–99)
Potassium: 3.6 mEq/L (ref 3.5–5.1)
SODIUM: 136 meq/L (ref 135–145)

## 2013-06-30 LAB — HEPATIC FUNCTION PANEL
ALK PHOS: 78 U/L (ref 39–117)
ALT: 29 U/L (ref 0–53)
AST: 54 U/L — ABNORMAL HIGH (ref 0–37)
Albumin: 3.6 g/dL (ref 3.5–5.2)
Bilirubin, Direct: 0.5 mg/dL — ABNORMAL HIGH (ref 0.0–0.3)
Total Bilirubin: 1.7 mg/dL — ABNORMAL HIGH (ref 0.3–1.2)
Total Protein: 7.1 g/dL (ref 6.0–8.3)

## 2013-06-30 LAB — HEMOGLOBIN A1C: HEMOGLOBIN A1C: 4.9 % (ref 4.6–6.5)

## 2013-06-30 LAB — PSA: PSA: 0.38 ng/mL (ref 0.10–4.00)

## 2013-06-30 LAB — TSH: TSH: 1.45 u[IU]/mL (ref 0.35–5.50)

## 2013-06-30 MED ORDER — METOPROLOL TARTRATE 25 MG PO TABS: ORAL_TABLET | ORAL | Status: DC

## 2013-06-30 MED ORDER — ESCITALOPRAM OXALATE 10 MG PO TABS: 10.0000 mg | ORAL_TABLET | Freq: Every day | ORAL | Status: DC

## 2013-06-30 NOTE — Assessment & Plan Note (Signed)
For a1c today  

## 2013-06-30 NOTE — Patient Instructions (Addendum)
Please take all new medication as prescribed- the lexapro  Please continue all other medications as before, and refills have been done if requested. Please have the pharmacy call with any other refills you may need. Please continue your efforts at being more active, low cholesterol diet, and weight control. You are otherwise up to date with prevention measures today. Please keep your appointments with your specialists as you may have planned  Please go to the LAB in the Basement (turn left off the elevator) for the tests to be done today You will be contacted by phone if any changes need to be made immediately.  Otherwise, you will receive a letter about your results with an explanation, but please check with MyChart first.  Please remember to sign up for My Chart if you have not done so, as this will be important to you in the future with finding out test results, communicating by private email, and scheduling acute appointments online when needed.  Please return in 1 year for your yearly visit, or sooner if needed  Please stop smoking

## 2013-06-30 NOTE — Progress Notes (Signed)
Pre-visit discussion using our clinic review tool. No additional management support is needed unless otherwise documented below in the visit note.  

## 2013-06-30 NOTE — Assessment & Plan Note (Signed)
Ok for lexapro 10 qd 

## 2013-06-30 NOTE — Progress Notes (Signed)
Subjective:    Patient ID: Shawn Peters, male    DOB: [birth year], 53 y.o.   MRN: [id number]  HPI Here for wellness and f/u;  Overall doing ok;  Pt denies CP, worsening SOB, DOE, wheezing, orthopnea, PND, worsening LE edema, palpitations, dizziness or syncope.  Pt denies neurological change such as new headache, facial or extremity weakness.  Pt denies polydipsia, polyuria, or low sugar symptoms. Pt states overall good compliance with treatment and medications, good tolerability, and has been trying to follow lower cholesterol diet.  Pt denies worsening depressive symptoms, suicidal ideation or panic. No fever, night sweats, wt loss, loss of appetite, or other constitutional symptoms.  Pt states good ability with ADL's, has low fall risk, home safety reviewed and adequate, no other significant changes in hearing or vision, and only occasionally active with exercise. Still smoking, trying to quit Past Medical History  Diagnosis Date  . ANXIETY 05/07/2008  . HYPERLIPIDEMIA 05/07/2008  . HYPOTHYROIDISM 05/07/2008  . Erectile dysfunction 11/30/2010  . 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 Acute pancreatitis 2008   Past Surgical History  Procedure Laterality Date  . S/p left fourth finger tendon surgury  1998    reports that he has been smoking.  He has never used smokeless tobacco. He reports that he drinks about 12.6 ounces of alcohol per week. He reports that he does not use illicit drugs. family history includes Cancer in his other; Diabetes in his other; Heart disease in his other. Allergies  Allergen Reactions  . Penicillins    Current Outpatient Prescriptions on File Prior to Visit  Medication Sig Dispense Refill  . clonazePAM (KLONOPIN) 1 MG tablet 1/2 - 1 twice daily as needed  60 tablet  5  . levothyroxine (SYNTHROID, LEVOTHROID) 50 MCG tablet TAKE 1 TABLET BY MOUTH DAILY.  90 tablet  0  . levothyroxine (SYNTHROID, LEVOTHROID) 50 MCG tablet Take 1 tablet (50 mcg total) by mouth daily.  90  tablet  3   No current facility-administered medications on file prior to visit.   Review of Systems Constitutional: Negative for diaphoresis, activity change, appetite change or unexpected weight change.  HENT: Negative for hearing loss, ear pain, facial swelling, mouth sores and neck stiffness.   Eyes: Negative for pain, redness and visual disturbance.  Respiratory: Negative for shortness of breath and wheezing.   Cardiovascular: Negative for chest pain and palpitations.  Gastrointestinal: Negative for diarrhea, blood in stool, abdominal distention or other pain Genitourinary: Negative for hematuria, flank pain or change in urine volume.  Musculoskeletal: Negative for myalgias and joint swelling.  Skin: Negative for color change and wound.  Neurological: Negative for syncope and numbness. other than noted Hematological: Negative for adenopathy.  Psychiatric/Behavioral: Negative for hallucinations, self-injury, decreased concentration and agitation.      Objective:   Physical Exam BP 120/74  Pulse 104  Temp(Src) 98.6 F (37 C) (Oral)  Ht 5\' 7"  (1.702 m)  Wt 135 lb 8 oz (61.462 kg)  BMI 21.22 kg/m2  SpO2 97% VS noted,  Constitutional: Pt is oriented to person, place, and time. Appears well-developed and well-nourished.  Head: Normocephalic and atraumatic.  Right Ear: External ear normal.  Left Ear: External ear normal.  Nose: Nose normal.  Mouth/Throat: Oropharynx is clear and moist.  Eyes: Conjunctivae and EOM are normal. Pupils are equal, round, and reactive to light.  Neck: Normal range of motion. Neck supple. No JVD present. No tracheal deviation present.  Cardiovascular: Normal rate, regular rhythm, normal heart sounds and  intact distal pulses.   Pulmonary/Chest: Effort normal and breath sounds normal.  Abdominal: Soft. Bowel sounds are normal. There is no tenderness. No HSM  Musculoskeletal: Normal range of motion. Exhibits no edema.  Lymphadenopathy:  Has no cervical  adenopathy.  Neurological: Pt is alert and oriented to person, place, and time. Pt has normal reflexes. No cranial nerve deficit.  Skin: Skin is warm and dry. No rash noted.  Psychiatric:  Has  Nervous 1-2+ mood and affect. Behavior is normal.         Assessment & Plan:

## 2013-06-30 NOTE — Assessment & Plan Note (Signed)
Also for f/u today

## 2013-06-30 NOTE — Assessment & Plan Note (Signed)

## 2013-07-21 ENCOUNTER — Other Ambulatory Visit: Payer: Self-pay | Admitting: Internal Medicine

## 2013-07-22 NOTE — Telephone Encounter (Signed)
Done hardcopy to robin  

## 2013-07-22 NOTE — Telephone Encounter (Signed)
[facility] [address] [place]

## 2013-09-01 ENCOUNTER — Other Ambulatory Visit: Payer: Self-pay | Admitting: Internal Medicine

## 2014-03-12 ENCOUNTER — Other Ambulatory Visit: Payer: Self-pay

## 2014-03-12 MED ORDER — CLONAZEPAM 1 MG PO TABS: ORAL_TABLET | ORAL | Status: DC

## 2014-03-12 NOTE — Telephone Encounter (Signed)
Called refill into pharmacy spoke with Stephanie/LMB

## 2014-03-12 NOTE — Telephone Encounter (Signed)
Done hardcopy to david 

## 2014-08-21 ENCOUNTER — Other Ambulatory Visit: Payer: Self-pay | Admitting: Internal Medicine

## 2014-09-18 ENCOUNTER — Other Ambulatory Visit: Payer: Self-pay | Admitting: Internal Medicine

## 2014-09-22 ENCOUNTER — Other Ambulatory Visit: Payer: Self-pay | Admitting: Internal Medicine

## 2014-09-23 ENCOUNTER — Telehealth: Payer: Self-pay | Admitting: Internal Medicine

## 2014-09-23 MED ORDER — LEVOTHYROXINE SODIUM 50 MCG PO TABS: 50.0000 ug | ORAL_TABLET | Freq: Every day | ORAL | Status: DC

## 2014-09-23 NOTE — Telephone Encounter (Signed)
rx done

## 2014-09-23 NOTE — Telephone Encounter (Signed)
Sent 30 day until appt../lmb 

## 2014-09-23 NOTE — Addendum Note (Signed)
Addended by: Corwin LevinsJOHN, JAMES W on: 09/23/2014 05:18 PM   Modules accepted: Orders

## 2014-09-23 NOTE — Telephone Encounter (Signed)
Patient is scheduled for a visit on 09/30/2014. He is almost out of levothyroxine (SYNTHROID, LEVOTHROID) 50 MCG tablet [[id number]][[id number]] . Asking if you will fill a 7 day script to [facility] to hold him until his visit.

## 2014-09-30 ENCOUNTER — Ambulatory Visit (INDEPENDENT_AMBULATORY_CARE_PROVIDER_SITE_OTHER): Payer: No Typology Code available for payment source | Admitting: Internal Medicine

## 2014-09-30 ENCOUNTER — Other Ambulatory Visit (INDEPENDENT_AMBULATORY_CARE_PROVIDER_SITE_OTHER): Payer: No Typology Code available for payment source

## 2014-09-30 ENCOUNTER — Encounter: Payer: Self-pay | Admitting: Internal Medicine

## 2014-09-30 VITALS — BP 122/80 | HR 87 | Temp 99.6°F | Resp 18 | Ht 67.0 in | Wt 130.1 lb

## 2014-09-30 DIAGNOSIS — R7302 Impaired glucose tolerance (oral): Secondary | ICD-10-CM

## 2014-09-30 DIAGNOSIS — D7589 Other specified diseases of blood and blood-forming organs: Secondary | ICD-10-CM

## 2014-09-30 DIAGNOSIS — Z0000 Encounter for general adult medical examination without abnormal findings: Secondary | ICD-10-CM

## 2014-09-30 LAB — PSA: PSA: 0.26 ng/mL (ref 0.10–4.00)

## 2014-09-30 LAB — CBC WITH DIFFERENTIAL/PLATELET
BASOS ABS: 0 10*3/uL (ref 0.0–0.1)
Basophils Relative: 0.4 % (ref 0.0–3.0)
EOS ABS: 0.2 10*3/uL (ref 0.0–0.7)
Eosinophils Relative: 2.8 % (ref 0.0–5.0)
HEMATOCRIT: 41.6 % (ref 39.0–52.0)
HEMOGLOBIN: 14.8 g/dL (ref 13.0–17.0)
LYMPHS ABS: 1.4 10*3/uL (ref 0.7–4.0)
Lymphocytes Relative: 21.6 % (ref 12.0–46.0)
MCHC: 35.6 g/dL (ref 30.0–36.0)
MCV: 110.5 fl — AB (ref 78.0–100.0)
Monocytes Absolute: 0.6 10*3/uL (ref 0.1–1.0)
Monocytes Relative: 8.5 % (ref 3.0–12.0)
NEUTROS ABS: 4.4 10*3/uL (ref 1.4–7.7)
NEUTROS PCT: 66.7 % (ref 43.0–77.0)
Platelets: 92 10*3/uL — ABNORMAL LOW (ref 150.0–400.0)
RBC: 3.77 Mil/uL — AB (ref 4.22–5.81)
RDW: 12.4 % (ref 11.5–15.5)
WBC: 6.7 10*3/uL (ref 4.0–10.5)

## 2014-09-30 LAB — VITAMIN B12: VITAMIN B 12: 1038 pg/mL — AB (ref 211–911)

## 2014-09-30 LAB — URINALYSIS, ROUTINE W REFLEX MICROSCOPIC
HGB URINE DIPSTICK: NEGATIVE
KETONES UR: NEGATIVE
Leukocytes, UA: NEGATIVE
Nitrite: NEGATIVE
RBC / HPF: NONE SEEN (ref 0–?)
Specific Gravity, Urine: 1.02 (ref 1.000–1.030)
Total Protein, Urine: NEGATIVE
URINE GLUCOSE: NEGATIVE
UROBILINOGEN UA: 2 — AB (ref 0.0–1.0)
WBC UA: NONE SEEN (ref 0–?)
pH: 6.5 (ref 5.0–8.0)

## 2014-09-30 LAB — HEMOGLOBIN A1C: Hgb A1c MFr Bld: 4.7 % (ref 4.6–6.5)

## 2014-09-30 LAB — TSH: TSH: 0.92 u[IU]/mL (ref 0.35–4.50)

## 2014-09-30 MED ORDER — CLONAZEPAM 1 MG PO TABS: ORAL_TABLET | ORAL | Status: DC

## 2014-09-30 MED ORDER — LEVOTHYROXINE SODIUM 50 MCG PO TABS: 50.0000 ug | ORAL_TABLET | Freq: Every day | ORAL | Status: DC

## 2014-09-30 MED ORDER — METOPROLOL TARTRATE 25 MG PO TABS: ORAL_TABLET | ORAL | Status: DC

## 2014-09-30 MED ORDER — METOPROLOL TARTRATE 25 MG PO TABS
ORAL_TABLET | ORAL | Status: DC
Start: 2014-09-30 — End: 2015-12-09

## 2014-09-30 NOTE — Assessment & Plan Note (Signed)
For f/u b12 

## 2014-09-30 NOTE — Patient Instructions (Signed)
Please continue all other medications as before, and refills have been done if requested.  Please have the pharmacy call with any other refills you may need.  Please continue your efforts at being more active, low cholesterol diet, and weight control.  You are otherwise up to date with prevention measures today.  Please keep your appointments with your specialists as you may have planned  Please go to the LAB in the Basement (turn left off the elevator) for the tests to be done today  You will be contacted by phone if any changes need to be made immediately.  Otherwise, you will receive a letter about your results with an explanation, but please check with MyChart first.  Please remember to sign up for MyChart if you have not done so, as this will be important to you in the future with finding out test results, communicating by private email, and scheduling acute appointments online when needed.  Please stop smoking  Please return in 1 year for your yearly visit, or sooner if needed, with Lab testing done 3-5 days before

## 2014-09-30 NOTE — Addendum Note (Signed)
Addended by: Corwin LevinsJOHN, JAMES W on: 09/30/2014 04:40 PM   Modules accepted: Orders

## 2014-09-30 NOTE — Assessment & Plan Note (Signed)

## 2014-09-30 NOTE — Assessment & Plan Note (Signed)
Also for a1c 

## 2014-09-30 NOTE — Progress Notes (Signed)
Subjective:    Patient ID: Shawn Peters, male    DOB: 07-28-1960, 54 y.o.   MRN: [id number]  HPI  Here for wellness and f/u;  Overall doing ok;  Pt denies Chest pain, worsening SOB, DOE, wheezing, orthopnea, PND, worsening LE edema, palpitations, dizziness or syncope.  Pt denies neurological change such as new headache, facial or extremity weakness.  Pt denies polydipsia, polyuria, or low sugar symptoms. Pt states overall good compliance with treatment and medications, good tolerability, and has been trying to follow appropriate diet.  Pt denies worsening depressive symptoms, suicidal ideation or panic. No fever, night sweats, wt loss, loss of appetite, or other constitutional symptoms.  Pt states good ability with ADL's, has low fall risk, home safety reviewed and adequate, no other significant changes in hearing or vision, and only occasionally active with exercise.  Denies hyper or hypo thyroid symptoms such as voice, skin or hair change.  Still smoking, thinking for now about quitting. No current complaints. Due for med refills Past Medical History  Diagnosis Date  . ANXIETY 05/07/2008  . HYPERLIPIDEMIA 05/07/2008  . HYPOTHYROIDISM 05/07/2008  . Erectile dysfunction 11/30/2010  . 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 Acute pancreatitis 2008  . Thrombocytopenia, unspecified 06/30/2013   Past Surgical History  Procedure Laterality Date  . S/p left fourth finger tendon surgury  1998    reports that he has been smoking.  He has never used smokeless tobacco. He reports that he drinks about 12.6 oz of alcohol per week. He reports that he does not use illicit drugs. family history includes Cancer in his other; Diabetes in his other; Heart disease in his other. Allergies  Allergen Reactions  . Penicillins    Current Outpatient Prescriptions on File Prior to Visit  Medication Sig Dispense Refill  . clonazePAM (KLONOPIN) 1 MG tablet TAKE 1/2 TO 1 TABLET BY MOUTH TWICE DAILY AS NEEDED. 60 tablet 5  .  levothyroxine (SYNTHROID, LEVOTHROID) 50 MCG tablet Take 1 tablet (50 mcg total) by mouth daily. 30 tablet 0  . levothyroxine (SYNTHROID, LEVOTHROID) 50 MCG tablet Take 1 tablet (50 mcg total) by mouth daily. 30 tablet 0   No current facility-administered medications on file prior to visit.   Review of Systems Constitutional: Negative for increased diaphoresis, other activity, appetite or siginficant weight change other than noted HENT: Negative for worsening hearing loss, ear pain, facial swelling, mouth sores and neck stiffness.   Eyes: Negative for other worsening pain, redness or visual disturbance.  Respiratory: Negative for shortness of breath and wheezing  Cardiovascular: Negative for chest pain and palpitations.  Gastrointestinal: Negative for diarrhea, blood in stool, abdominal distention or other pain Genitourinary: Negative for hematuria, flank pain or change in urine volume.  Musculoskeletal: Negative for myalgias or other joint complaints.  Skin: Negative for color change and wound or drainage.  Neurological: Negative for syncope and numbness. other than noted Hematological: Negative for adenopathy. or other swelling Psychiatric/Behavioral: Negative for hallucinations, SI, self-injury, decreased concentration or other worsening agitation.      Objective:   Physical Exam BP 122/80 mmHg  Pulse 87  Temp(Src) 99.6 F (37.6 C) (Oral)  Resp 18  Ht 5\' 7"  (1.702 m)  Wt 130 lb 1.9 oz (59.022 kg)  BMI 20.37 kg/m2  SpO2 98% VS noted,  Constitutional: Pt is oriented to person, place, and time. Appears well-developed and well-nourished, in no significant distress Head: Normocephalic and atraumatic.  Right Ear: External ear normal.  Left Ear: External ear normal.  Nose: Nose normal.  Mouth/Throat: Oropharynx is clear and moist.  Eyes: Conjunctivae and EOM are normal. Pupils are equal, round, and reactive to light.  Neck: Normal range of motion. Neck supple. No JVD present. No  tracheal deviation present or significant neck LA or mass Cardiovascular: Normal rate, regular rhythm, normal heart sounds and intact distal pulses.   Pulmonary/Chest: Effort normal and breath sounds without rales or wheezing  Abdominal: Soft. Bowel sounds are normal. NT. No HSM  Musculoskeletal: Normal range of motion. Exhibits no edema.  Lymphadenopathy:  Has no cervical adenopathy.  Neurological: Pt is alert and oriented to person, place, and time. Pt has normal reflexes. No cranial nerve deficit. Motor grossly intact Skin: Skin is warm and dry. No rash noted.  Psychiatric:  Has normal mood and affect. Behavior is normal.    Assessment & Plan:

## 2014-10-01 ENCOUNTER — Encounter: Payer: Self-pay | Admitting: Internal Medicine

## 2014-10-01 LAB — BASIC METABOLIC PANEL
BUN: 10 mg/dL (ref 6–23)
CALCIUM: 9.4 mg/dL (ref 8.4–10.5)
CO2: 26 mEq/L (ref 19–32)
CREATININE: 0.75 mg/dL (ref 0.40–1.50)
Chloride: 101 mEq/L (ref 96–112)
GFR: 115.29 mL/min (ref >60.00)
GLUCOSE: 106 mg/dL — AB (ref 70–99)
Potassium: 4.4 mEq/L (ref 3.5–5.1)
SODIUM: 137 meq/L (ref 135–145)

## 2014-10-01 LAB — LIPID PANEL
CHOLESTEROL: 132 mg/dL (ref 0–200)
HDL: 53 mg/dL (ref >39.00)
LDL CALC: 66 mg/dL (ref 0–99)
NonHDL: 79
Total CHOL/HDL Ratio: 2
Triglycerides: 66 mg/dL (ref 0.0–149.0)
VLDL: 13.2 mg/dL (ref 0.0–40.0)

## 2014-10-01 LAB — HEPATIC FUNCTION PANEL
ALT: 21 U/L (ref 0–53)
AST: 35 U/L (ref 0–37)
Albumin: 3.8 g/dL (ref 3.5–5.2)
Alkaline Phosphatase: 73 U/L (ref 39–117)
Bilirubin, Direct: 0.3 mg/dL (ref 0.0–0.3)
Total Bilirubin: 1 mg/dL (ref 0.2–1.2)
Total Protein: 7.3 g/dL (ref 6.0–8.3)

## 2014-12-17 ENCOUNTER — Ambulatory Visit (INDEPENDENT_AMBULATORY_CARE_PROVIDER_SITE_OTHER)
Admission: RE | Admit: 2014-12-17 | Discharge: 2014-12-17 | Disposition: A | Discharge status: Home / Self Care | Payer: No Typology Code available for payment source | Source: Ambulatory Visit | Attending: Internal Medicine | Admitting: Internal Medicine

## 2014-12-17 ENCOUNTER — Ambulatory Visit (INDEPENDENT_AMBULATORY_CARE_PROVIDER_SITE_OTHER): Payer: No Typology Code available for payment source | Admitting: Internal Medicine

## 2014-12-17 ENCOUNTER — Encounter: Payer: Self-pay | Admitting: Internal Medicine

## 2014-12-17 VITALS — BP 114/76 | HR 102 | Temp 98.1°F | Resp 20 | Wt 115.0 lb

## 2014-12-17 DIAGNOSIS — F17200 Nicotine dependence, unspecified, uncomplicated: Secondary | ICD-10-CM

## 2014-12-17 DIAGNOSIS — J209 Acute bronchitis, unspecified: Secondary | ICD-10-CM

## 2014-12-17 DIAGNOSIS — Z720 Tobacco use: Secondary | ICD-10-CM | POA: Diagnosis not present

## 2014-12-17 MED ORDER — AZITHROMYCIN 250 MG PO TABS: ORAL_TABLET | ORAL | Status: DC

## 2014-12-17 MED ORDER — PREDNISONE 20 MG PO TABS: 20.0000 mg | ORAL_TABLET | Freq: Two times a day (BID) | ORAL | Status: DC

## 2014-12-17 NOTE — Progress Notes (Signed)
   Subjective:    Patient ID: Shawn Peters, male    DOB: 1960-12-17, 54 y.o.   MRN: [id number]  HPI He describes a cough for the last 2-2.5 weeks. Within the last week it's become productive of clear secretions. It is associated with shortness of breath and wheezing. He also has noted some right-sided headaches. He has noted significant sweating as well. He's been using Delsym with marginal response.  He has no history of asthma. He has been a one pack-a-day smoker for decades. In the last 2 weeks he has decreased to half a pack a day or less.  Review of Systems  He denies extrinsic symptoms of itchy, watery eyes, sneezing. He has no facial pain, nasal purulence, dental pain, otic pain, otic discharge. He has been no fever or chills with sweats.      Objective:   Physical Exam   He has a goatee; he's also unshaven. Tympanic membranes are dull. Dental hygiene is fair. He has diffuse musical rhonchi and wheezing in all lung fields. PMI is in the epigastrium.  General appearance:Thin but adequately nourished; no acute distress or increased work of breathing is present.    Lymphatic: No  lymphadenopathy about the head, neck, or axilla .  Eyes: No conjunctival inflammation or lid edema is present. There is no scleral icterus.  Ears:  External ear exam shows no significant lesions or deformities.  Otoscopic examination reveals clear canals, tympanic membranes are intact bilaterally without bulging, retraction, inflammation or discharge.  Nose:  External nasal examination shows no deformity or inflammation. Nasal mucosa are dry without lesions or exudates No septal dislocation or deviation.No obstruction to airflow.   Oral exam:  lips and gums are healthy appearing.There is mild oropharyngeal erythema w/o exudate .  Neck:  No deformities, thyromegaly, masses, or tenderness noted.   Supple with full range of motion without pain.   Heart:  Slight tach; regular rhythm. S1 and S2 normal  without gallop, murmur, click, rub or other extra sounds.    Extremities:  No cyanosis, edema, or clubbing  noted    Skin: Warm & dry w/o tenting or jaundice. No significant lesions or rash.      Assessment & Plan:  #1 acute bronchitis with bronchospasm  #2 smoker  Plan: See orders and recommendations

## 2014-12-17 NOTE — Patient Instructions (Signed)
To use Anoro: Pull cap down to release medication. Blow out as much as possible then inhale powder as deeply as possible. Hold breath to count of ten then exhale.  Gargle and spit after use. Lot #: [id number] Expiration date:2/18  Please think about quitting smoking. Review the risks we discussed. Please call 1-800-QUIT-NOW (8195195579) for free smoking cessation counseling. There are multiple options are to help you stop smoking. These include nicotine patches, nicotine gum, and the new "E cigarette".   Your next office appointment will be determined based upon review of your pending  xrays  Those written interpretation of the lab results and instructions will be transmitted to you by mail for your records.  Critical results will be called.   Followup as needed for any active or acute issue. Please report any significant change in your symptoms.

## 2014-12-17 NOTE — Progress Notes (Signed)
Pre visit review using our clinic review tool, if applicable. No additional management support is needed unless otherwise documented below in the visit note. 

## 2014-12-21 ENCOUNTER — Telehealth: Payer: Self-pay | Admitting: Internal Medicine

## 2014-12-21 NOTE — Telephone Encounter (Signed)
Please advise 

## 2014-12-21 NOTE — Telephone Encounter (Signed)
Patient need another prescription of azithromycin (ZITHROMAX Z-PAK) 250 MG tablet, still not filling good no energy and short of breath. Would like the results of his xray.  Please advise

## 2014-12-21 NOTE — Telephone Encounter (Signed)
z pack as 1 qd X 6 days; not 2 day 1 Read him my comments on Xray report please

## 2014-12-23 ENCOUNTER — Other Ambulatory Visit: Payer: Self-pay | Admitting: Emergency Medicine

## 2014-12-23 MED ORDER — AZITHROMYCIN 250 MG PO TABS: ORAL_TABLET | ORAL | Status: DC

## 2014-12-23 NOTE — Telephone Encounter (Signed)
LVM for pt to call back.

## 2014-12-23 NOTE — Telephone Encounter (Signed)
Spoke with pt, informed of rx being sent in and x-ray results.

## 2014-12-23 NOTE — Telephone Encounter (Signed)
Patient returned your call His # is [phone]

## 2015-05-10 ENCOUNTER — Other Ambulatory Visit: Payer: Self-pay | Admitting: Internal Medicine

## 2015-05-11 NOTE — Telephone Encounter (Signed)
Rx faxed to pharmacy  

## 2015-07-05 ENCOUNTER — Other Ambulatory Visit: Payer: Self-pay | Admitting: Family

## 2015-07-05 NOTE — Telephone Encounter (Signed)
Done hardcopy to Corinne  

## 2015-07-06 NOTE — Telephone Encounter (Signed)
A user error has taken place.

## 2015-07-06 NOTE — Telephone Encounter (Signed)
Medication printed signed and sent to pharmacy 

## 2015-08-30 ENCOUNTER — Ambulatory Visit (INDEPENDENT_AMBULATORY_CARE_PROVIDER_SITE_OTHER): Payer: BLUE CROSS/BLUE SHIELD | Admitting: Internal Medicine

## 2015-08-30 ENCOUNTER — Ambulatory Visit (INDEPENDENT_AMBULATORY_CARE_PROVIDER_SITE_OTHER)
Admission: RE | Admit: 2015-08-30 | Discharge: 2015-08-30 | Disposition: A | Discharge status: Home / Self Care | Payer: BLUE CROSS/BLUE SHIELD | Source: Ambulatory Visit | Attending: Internal Medicine | Admitting: Internal Medicine

## 2015-08-30 ENCOUNTER — Encounter: Payer: Self-pay | Admitting: Internal Medicine

## 2015-08-30 VITALS — BP 128/82 | HR 104 | Temp 99.3°F | Resp 20 | Wt 132.0 lb

## 2015-08-30 DIAGNOSIS — R05 Cough: Secondary | ICD-10-CM

## 2015-08-30 DIAGNOSIS — F32A Depression, unspecified: Secondary | ICD-10-CM

## 2015-08-30 DIAGNOSIS — R062 Wheezing: Secondary | ICD-10-CM | POA: Insufficient documentation

## 2015-08-30 DIAGNOSIS — Z0000 Encounter for general adult medical examination without abnormal findings: Secondary | ICD-10-CM

## 2015-08-30 DIAGNOSIS — R059 Cough, unspecified: Secondary | ICD-10-CM | POA: Insufficient documentation

## 2015-08-30 DIAGNOSIS — Z0189 Encounter for other specified special examinations: Secondary | ICD-10-CM | POA: Diagnosis not present

## 2015-08-30 DIAGNOSIS — F329 Major depressive disorder, single episode, unspecified: Secondary | ICD-10-CM

## 2015-08-30 MED ORDER — PREDNISONE 10 MG PO TABS: ORAL_TABLET | ORAL | Status: DC

## 2015-08-30 MED ORDER — LEVOFLOXACIN 500 MG PO TABS: 500.0000 mg | ORAL_TABLET | Freq: Every day | ORAL | Status: DC

## 2015-08-30 MED ORDER — HYDROCODONE-HOMATROPINE 5-1.5 MG/5ML PO SYRP: 5.0000 mL | ORAL_SOLUTION | Freq: Four times a day (QID) | ORAL | Status: DC | PRN

## 2015-08-30 MED ORDER — METHYLPREDNISOLONE ACETATE 80 MG/ML IJ SUSP
80.0000 mg | Freq: Once | INTRAMUSCULAR | Status: AC
  Administered 2015-08-30: 80 mg via INTRAMUSCULAR

## 2015-08-30 NOTE — Progress Notes (Signed)
   Subjective:    Patient ID: Shawn Peters, male    DOB: 1961/04/03, 55 y.o.   MRN: [id number]  HPI  Here with acute onset mild to mod 2-3 days ST, HA, general weakness and malaise, with prod cough greenish sputum, but Pt denies chest pain, increased sob or doe, wheezing, orthopnea, PND, increased LE swelling, palpitations, dizziness or syncope, except for onset mild wheezing/sob last pm.  Still smoking but plans to quit soon.  Denies worsening depressive symptoms, suicidal ideation, or panic; has ongoing anxiety, not increased recently.  Past Medical History  Diagnosis Date  . ANXIETY 05/07/2008  . HYPERLIPIDEMIA 05/07/2008  . HYPOTHYROIDISM 05/07/2008  . Erectile dysfunction 11/30/2010  . 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 Acute pancreatitis 2008  . Thrombocytopenia, unspecified (HCC) 06/30/2013   Past Surgical History  Procedure Laterality Date  . S/p left fourth finger tendon surgury  1998    reports that he has been smoking.  He has never used smokeless tobacco. He reports that he drinks about 12.6 oz of alcohol per week. He reports that he does not use illicit drugs. family history includes Cancer in his other; Diabetes in his other; Heart disease in his other. Allergies  Allergen Reactions  . Penicillins    Current Outpatient Prescriptions on File Prior to Visit  Medication Sig Dispense Refill  . clonazePAM (KLONOPIN) 1 MG tablet TAKE 1/2 TO 1 TABLET BY MOUTH TWICE DAILY AS NEEDED. 60 tablet 2  . levothyroxine (SYNTHROID, LEVOTHROID) 50 MCG tablet Take 1 tablet (50 mcg total) by mouth daily. 90 tablet 3  . metoprolol tartrate (LOPRESSOR) 25 MG tablet 1/2 - 1 by mouth daily as needed 90 tablet 3   No current facility-administered medications on file prior to visit.   Review of Systems  Constitutional: Negative for unusual diaphoresis or night sweats HENT: Negative for ear swelling or discharge Eyes: Negative for worsening visual haziness  Respiratory: Negative for choking and  stridor.   Gastrointestinal: Negative for distension or worsening eructation Genitourinary: Negative for retention or change in urine volume.  Musculoskeletal: Negative for other MSK pain or swelling Skin: Negative for color change and worsening wound Neurological: Negative for tremors and numbness other than noted  Psychiatric/Behavioral: Negative for decreased concentration or agitation other than above       Objective:   Physical Exam BP 128/82 mmHg  Pulse 104  Temp(Src) 99.3 F (37.4 C) (Oral)  Resp 20  Wt 132 lb (59.875 kg)  SpO2 96% VS noted, mild ill Constitutional: Pt appears in no apparent distress HENT: Head: NCAT.  Right Ear: External ear normal.  Left Ear: External ear normal.  Bilat tm's with mild erythema.  Max sinus areas non tender.  Pharynx with mild erythema, no exudate Eyes: . Pupils are equal, round, and reactive to light. Conjunctivae and EOM are normal Neck: Normal range of motion. Neck supple.  Cardiovascular: Normal rate and regular rhythm.   Pulmonary/Chest: Effort normal and breath sounds decreased without rales but with bilat scattered wheezing.  Neurological: Pt is alert. Not confused , motor grossly intact Skin: Skin is warm. No rash, no LE edema Psychiatric: Pt behavior is normal. No agitation.     Assessment & Plan:

## 2015-08-30 NOTE — Patient Instructions (Addendum)
You had the steroid shot today   Please take all new medication as prescribed - the antibiotic, cough medicine, and prednisone  You will be contacted regarding the referral for: Inhaler as well  Please continue all other medications as before, and refills have been done if requested.  Please have the pharmacy call with any other refills you may need.  Please continue your efforts at being more active, low cholesterol diet, and weight control.  Please keep your appointments with your specialists as you may have planned  Please go to the XRAY Department in the Basement (go straight as you get off the elevator) for the x-ray testing  You will be contacted by phone if any changes need to be made immediately.  Otherwise, you will receive a letter about your results with an explanation, but please check with MyChart first.  Please remember to sign up for MyChart if you have not done so, as this will be important to you in the future with finding out test results, communicating by private email, and scheduling acute appointments online when needed.  Please stop smoking  Please return in 6 months, or sooner if needed, with Lab testing done 3-5 days before

## 2015-08-30 NOTE — Progress Notes (Signed)
Pre visit review using our clinic review tool, if applicable. No additional management support is needed unless otherwise documented below in the visit note. 

## 2015-09-01 NOTE — Assessment & Plan Note (Addendum)
Mild to mod, cw bronchitis vs pna, for cxr, for antibx course,  Cough med prn, to f/u any worsening symptoms or concerns 

## 2015-09-01 NOTE — Assessment & Plan Note (Signed)
stable overall by history and exam, recent data reviewed with pt, and pt to continue medical treatment as before,  to f/u any worsening symptoms or concerns Lab Results  Component Value Date   WBC 6.7 09/30/2014   HGB 14.8 09/30/2014   HCT 41.6 09/30/2014   PLT 92.0* 09/30/2014   GLUCOSE 106* 09/30/2014   CHOL 132 09/30/2014   TRIG 66.0 09/30/2014   HDL 53.00 09/30/2014   LDLCALC 66 09/30/2014   ALT 21 09/30/2014   AST 35 09/30/2014   NA 137 09/30/2014   K 4.4 09/30/2014   CL 101 09/30/2014   CREATININE 0.75 09/30/2014   BUN 10 09/30/2014   CO2 26 09/30/2014   TSH 0.92 09/30/2014   PSA 0.26 09/30/2014   HGBA1C 4.7 09/30/2014

## 2015-09-01 NOTE — Assessment & Plan Note (Signed)
Mild to mod, for depomedrol IM, predpac asd, to f/u any worsening symptoms or concerns 

## 2015-10-12 ENCOUNTER — Other Ambulatory Visit: Payer: Self-pay | Admitting: Internal Medicine

## 2015-12-09 ENCOUNTER — Other Ambulatory Visit: Payer: Self-pay | Admitting: Internal Medicine

## 2016-01-03 ENCOUNTER — Telehealth: Payer: Self-pay

## 2016-01-03 MED ORDER — CLONAZEPAM 1 MG PO TABS: ORAL_TABLET | ORAL | 2 refills | Status: DC

## 2016-01-03 NOTE — Telephone Encounter (Signed)
Done hardcopy to Corinne  

## 2016-01-03 NOTE — Telephone Encounter (Signed)
Please advise patient is requesting refill on clonazepam 

## 2016-01-03 NOTE — Addendum Note (Signed)
Addended by: Corwin LevinsJOHN, JAMES W on: 01/03/2016 01:27 PM   Modules accepted: Orders

## 2016-01-03 NOTE — Telephone Encounter (Signed)
Medication refill sent to pharmacy  

## 2016-07-03 ENCOUNTER — Telehealth: Payer: Self-pay

## 2016-07-03 NOTE — Telephone Encounter (Signed)
Medication refill sent to pharmacy for klonopin

## 2016-08-23 ENCOUNTER — Ambulatory Visit (HOSPITAL_COMMUNITY)
Admission: EM | Admit: 2016-08-23 | Discharge: 2016-08-23 | Disposition: A | Discharge status: Home / Self Care | Payer: BLUE CROSS/BLUE SHIELD | Attending: Family Medicine | Admitting: Family Medicine

## 2016-08-23 ENCOUNTER — Encounter (HOSPITAL_COMMUNITY): Payer: Self-pay | Admitting: Family Medicine

## 2016-08-23 DIAGNOSIS — R109 Unspecified abdominal pain: Secondary | ICD-10-CM

## 2016-08-23 DIAGNOSIS — M5489 Other dorsalgia: Secondary | ICD-10-CM | POA: Diagnosis not present

## 2016-08-23 DIAGNOSIS — T148XXA Other injury of unspecified body region, initial encounter: Secondary | ICD-10-CM | POA: Diagnosis not present

## 2016-08-23 DIAGNOSIS — R21 Rash and other nonspecific skin eruption: Secondary | ICD-10-CM

## 2016-08-23 MED ORDER — TRIAMCINOLONE ACETONIDE 0.1 % EX CREA: 1.0000 "application " | TOPICAL_CREAM | Freq: Two times a day (BID) | CUTANEOUS | 0 refills | Status: DC

## 2016-08-23 MED ORDER — KETOROLAC TROMETHAMINE 30 MG/ML IJ SOLN
INTRAMUSCULAR | Status: AC
  Filled 2016-08-23: qty 1

## 2016-08-23 MED ORDER — DICLOFENAC SODIUM 75 MG PO TBEC: 75.0000 mg | DELAYED_RELEASE_TABLET | Freq: Two times a day (BID) | ORAL | 0 refills | Status: DC

## 2016-08-23 MED ORDER — CYCLOBENZAPRINE HCL 10 MG PO TABS: 10.0000 mg | ORAL_TABLET | Freq: Two times a day (BID) | ORAL | 0 refills | Status: DC | PRN

## 2016-08-23 MED ORDER — KETOROLAC TROMETHAMINE 30 MG/ML IJ SOLN
30.0000 mg | Freq: Once | INTRAMUSCULAR | Status: AC
  Administered 2016-08-23: 30 mg via INTRAMUSCULAR

## 2016-08-23 NOTE — ED Provider Notes (Signed)
CSN: [id number]     Arrival date & time 08/23/16  1005 History   None    Chief Complaint  Patient presents with  . Abdominal Pain   (Consider location/radiation/quality/duration/timing/severity/associated sxs/prior Treatment) 56 year old male presents to clinic for evaluation of musculoskeletal pain. He reports he lifted a heavy toolbox yesterday, and is felt pain since.   The history is provided by the patient.  Muscle Pain  This is a new problem. The current episode started 12 to 24 hours ago. The problem occurs constantly. The problem has been gradually worsening. Pertinent negatives include no chest pain, no abdominal pain, no headaches and no shortness of breath. The symptoms are aggravated by walking, bending and twisting. Nothing relieves the symptoms. He has tried nothing for the symptoms. The treatment provided no relief.    Past Medical History:  Diagnosis Date  . Acute pancreatitis 2008  . ANXIETY 05/07/2008  . Erectile dysfunction 11/30/2010  . HYPERLIPIDEMIA 05/07/2008  . HYPOTHYROIDISM 05/07/2008  . 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 Thrombocytopenia, unspecified 06/30/2013   Past Surgical History:  Procedure Laterality Date  . s/p left fourth finger tendon surgury  1998   Family History  Problem Relation Age of Onset  . Cancer Other     lung cancer  . Diabetes Other   . Heart disease Other    Social History  Substance Use Topics  . Smoking status: Current Every Day Smoker    Packs/day: 1.00  . Smokeless tobacco: Never Used  . Alcohol use 12.6 oz/week    21 Shots of liquor per week    Review of Systems  Respiratory: Negative for cough, chest tightness, shortness of breath and wheezing.   Cardiovascular: Negative for chest pain.  Gastrointestinal: Negative for abdominal pain, constipation, diarrhea, nausea and vomiting.  Genitourinary: Negative for dysuria, frequency, scrotal swelling and testicular pain.  Musculoskeletal: Positive for back pain. Negative for  gait problem, neck pain and neck stiffness.  Neurological: Negative for weakness and headaches.  All other systems reviewed and are negative.   Allergies  Penicillins  Home Medications   Prior to Admission medications   Medication Sig Start Date End Date Taking? Authorizing Provider  clonazePAM (KLONOPIN) 1 MG tablet TAKE 1/2 TO 1 TABLET BY MOUTH TWICE DAILY AS NEEDED. 01/03/16   Corwin LevinsJames W John, MD  cyclobenzaprine (FLEXERIL) 10 MG tablet Take 1 tablet (10 mg total) by mouth 2 (two) times daily as needed for muscle spasms. 08/23/16   Dorena BodoLawrence Ziquan Fidel, NP  diclofenac (VOLTAREN) 75 MG EC tablet Take 1 tablet (75 mg total) by mouth 2 (two) times daily. 08/23/16   Dorena BodoLawrence Keniyah Gelinas, NP  HYDROcodone-homatropine [facility](HYCODAN) 5-1.5 MG/5ML syrup Take 5 mLs by mouth every 6 (six) hours as needed for cough. 08/30/15   Corwin LevinsJames W John, MD  levofloxacin (LEVAQUIN) 500 MG tablet Take 1 tablet (500 mg total) by mouth daily. 08/30/15   Corwin LevinsJames W John, MD  levothyroxine (SYNTHROID, LEVOTHROID) 50 MCG tablet TAKE 1 TABLET (50 MCG TOTAL) BY MOUTH DAILY. 10/13/15   Corwin LevinsJames W John, MD  metoprolol tartrate (LOPRESSOR) 25 MG tablet TAKE 1/2 TO 1 TABLET BY MOUTH ONCE DAILY AS NEEDED 12/12/15   Corwin LevinsJames W John, MD  predniSONE (DELTASONE) 10 MG tablet 3 tabs by mouth per day for 3 days,2tabs per day for 3 days,1tab per day for 3 days 08/30/15   Corwin LevinsJames W John, MD   Meds Ordered and Administered this Visit   Medications  ketorolac (TORADOL) 30 MG/ML injection 30 mg (not administered)  BP 138/83   Pulse 97   Temp 98.2 F (36.8 C)   Resp 18   SpO2 100%  No data found.   Physical Exam  Constitutional: He is oriented to person, place, and time. He appears well-developed and well-nourished. No distress.  HENT:  Head: Normocephalic and atraumatic.  Right Ear: External ear normal.  Left Ear: External ear normal.  Cardiovascular: Normal rate and regular rhythm.   Pulmonary/Chest: Effort normal and breath sounds normal.  Abdominal:  Soft. Bowel sounds are normal. He exhibits no distension. There is no tenderness.  Musculoskeletal:       Arms: Neurological: He is alert and oriented to person, place, and time.  Skin: Skin is warm and dry. Capillary refill takes less than 2 seconds. He is not diaphoretic.  Psychiatric: He has a normal mood and affect. His behavior is normal.  Nursing note and vitals reviewed.   Urgent Care Course     Procedures (including critical care time)  Labs Review Labs Reviewed - No data to display  Imaging Review No results found.     MDM   1. Muscle strain    Treating for musculoskeletal pain, given Toradol in clinic, discharged with prescription of diclofenac, and Flexeril, provided work note. Advised to follow-up with primary care, return to clinic if symptoms fail to resolve, go to the emergency room at any time if symptoms worsen.    Dorena Bodo, NP 08/23/16 1101

## 2016-08-23 NOTE — Discharge Instructions (Addendum)
You most likely have a strained muscle. You have received an injection of Toradol in clinic today. I have prescribed two medicines for your pain. The first is diclofenac, take 1 tablet twice a day and the other is Flexeril, take 1 tablet twice a day. Flexeril may cause drowsiness so do not drive until you know how this medicine affects you. Also do not drink any alcohol either. You may apply ice and alternate with heat for 15 minutes at a time 4 times daily and for additional pain control you may take tylenol over the counter ever 4 hours but do not take more than 4000 mg a day. Should your pain continue or fail to resolve, follow up with your primary care provider or return to clinic as needed.   For your rash, I prescribed triamcinolone, apply to the affected area twice a day. If this persists, follow up with her primary care provider, or return to clinic

## 2016-08-23 NOTE — ED Triage Notes (Signed)
Pt here for pain in left side/rib area. St that he nitced it after picking up a tool box yesterday. sts hurts with movement and just sitting.

## 2016-08-29 ENCOUNTER — Ambulatory Visit (INDEPENDENT_AMBULATORY_CARE_PROVIDER_SITE_OTHER): Payer: BLUE CROSS/BLUE SHIELD | Admitting: Internal Medicine

## 2016-08-29 ENCOUNTER — Encounter: Payer: Self-pay | Admitting: Internal Medicine

## 2016-08-29 VITALS — BP 140/84 | HR 104 | Temp 99.1°F | Ht 67.0 in | Wt 146.2 lb

## 2016-08-29 DIAGNOSIS — R079 Chest pain, unspecified: Secondary | ICD-10-CM | POA: Diagnosis not present

## 2016-08-29 DIAGNOSIS — R1012 Left upper quadrant pain: Secondary | ICD-10-CM | POA: Diagnosis not present

## 2016-08-29 MED ORDER — HYDROCODONE-ACETAMINOPHEN 5-325 MG PO TABS: 1.0000 | ORAL_TABLET | Freq: Four times a day (QID) | ORAL | 0 refills | Status: DC | PRN

## 2016-08-29 NOTE — Assessment & Plan Note (Signed)
For abd u/s as above, labs as ordered including ua,  to f/u any worsening symptoms or concerns

## 2016-08-29 NOTE — Assessment & Plan Note (Signed)
Likely msk it seems but will need to r/o other, ok for work note off work tomorrow, hydrocodone prn limited rx, cont muscle relaxer prn , also for cxr/rib films and Abd u/s

## 2016-08-29 NOTE — Patient Instructions (Addendum)
Please take all new medication as prescribed - the hydrocodone for pain  OK to continue the muscle relaxer as needed  Please go to the XRAY Department in the Basement (go straight as you get off the elevator) for the x-ray testing  - tomorrow for chest xray and rib xray  You will be contacted regarding the referral for: Abdomen ultrasound asap  Please continue all other medications as before  Please have the pharmacy call with any other refills you may need.  Please keep your appointments with your specialists as you may have planned  You are given the work note as well for tomorrow

## 2016-08-29 NOTE — Progress Notes (Signed)
Subjective:    Patient ID: Shawn Peters, male    DOB: 1961/01/31, 56 y.o.   MRN: [id number]  HPI  Here with left lower back and left side/left chest pain mod to severe just not getting better after presumed injury with lifting a heavy toolbox at work 3 days ago.  Has not missed work but today just became too much, not better with advil, worse to bend, twist and breathe.  Seen at [facility] - nsaid and muscle relaxer not seeming to help or helps small. In the last days his LUQ seems even more swollen and painuf. Pt denies  increased sob or doe, wheezing, orthopnea, PND, increased LE swelling, palpitations, dizziness or syncope. Denies urinary symptoms such as dysuria, frequency, urgency,  hematuria or n/v, fever, chills.   Pt denies fever, wt loss, night sweats, loss of appetite, or other constitutional symptoms Past Medical History:  Diagnosis Date  . Acute pancreatitis 2008  . ANXIETY 05/07/2008  . Erectile dysfunction 11/30/2010  . HYPERLIPIDEMIA 05/07/2008  . HYPOTHYROIDISM 05/07/2008  . 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 Thrombocytopenia, unspecified 06/30/2013   Past Surgical History:  Procedure Laterality Date  . s/p left fourth finger tendon surgury  1998    reports that he has been smoking.  He has been smoking about 1.00 pack per day. He has never used smokeless tobacco. He reports that he drinks about 12.6 oz of alcohol per week . He reports that he does not use drugs. family history includes Cancer in his other; Diabetes in his other; Heart disease in his other. Allergies  Allergen Reactions  . Penicillins    Current Outpatient Prescriptions on File Prior to Visit  Medication Sig Dispense Refill  . clonazePAM (KLONOPIN) 1 MG tablet TAKE 1/2 TO 1 TABLET BY MOUTH TWICE DAILY AS NEEDED. 60 tablet 2  . cyclobenzaprine (FLEXERIL) 10 MG tablet Take 1 tablet (10 mg total) by mouth 2 (two) times daily as needed for muscle spasms. 20 tablet 0  . diclofenac (VOLTAREN) 75 MG EC tablet Take 1 tablet  (75 mg total) by mouth 2 (two) times daily. 20 tablet 0  . levothyroxine (SYNTHROID, LEVOTHROID) 50 MCG tablet TAKE 1 TABLET (50 MCG TOTAL) BY MOUTH DAILY. 90 tablet 3  . metoprolol tartrate (LOPRESSOR) 25 MG tablet TAKE 1/2 TO 1 TABLET BY MOUTH ONCE DAILY AS NEEDED 90 tablet 1  . triamcinolone cream (KENALOG) 0.1 % Apply 1 application topically 2 (two) times daily. 30 g 0   No current facility-administered medications on file prior to visit.    Review of Systems  Constitutional: Negative for other unusual diaphoresis or sweats HENT: Negative for ear discharge or swelling Eyes: Negative for other worsening visual disturbances Respiratory: Negative for stridor or other swelling  Gastrointestinal: Negative for worsening distension or other blood Genitourinary: Negative for retention or other urinary change Musculoskeletal: Negative for other MSK pain or swelling Skin: Negative for color change or other new lesions Neurological: Negative for worsening tremors and other numbness  Psychiatric/Behavioral: Negative for worsening agitation or other fatigue All other system neg per pt    Objective:   Physical Exam BP 140/84 (BP Location: Left Arm, Patient Position: Sitting, Cuff Size: Normal)   Pulse (!) 104   Temp 99.1 F (37.3 C) (Oral)   Ht  (1.702 m)   Wt 146 lb 4 oz (66.3 kg)   SpO2 98%   BMI 22.91 kg/m  VS noted,  Constitutional: Pt appears in NAD HENT: Head: NCAT.  Right  Ear: External ear normal.  Left Ear: External ear normal.  Eyes: . Pupils are equal, round, and reactive to light. Conjunctivae and EOM are normal Nose: without d/c or deformity Neck: Neck supple. Gross normal ROM Cardiovascular: Normal rate and regular rhythm.   Pulmonary/Chest: Effort normal and breath sounds decreased without rales or wheezing.  + left lumbar paravertebral tender, as well as left costal margin and side about t8-t10 dermatome Abd:  Soft, NT, ND, + BS, no organomegaly Neurological: Pt  is alert. At baseline orientation, motor grossly intact Skin: Skin is warm. No rashes, other new lesions, no LE edema Psychiatric: Pt behavior is normal without agitation  No other exam findings    Assessment & Plan:

## 2016-08-29 NOTE — Progress Notes (Signed)
Pre visit review using our clinic review tool, if applicable. No additional management support is needed unless otherwise documented below in the visit note. 

## 2016-08-30 ENCOUNTER — Telehealth: Payer: Self-pay

## 2016-08-30 ENCOUNTER — Encounter: Payer: Self-pay | Admitting: Internal Medicine

## 2016-08-30 ENCOUNTER — Other Ambulatory Visit: Payer: Self-pay | Admitting: Internal Medicine

## 2016-08-30 ENCOUNTER — Ambulatory Visit (INDEPENDENT_AMBULATORY_CARE_PROVIDER_SITE_OTHER)
Admission: RE | Admit: 2016-08-30 | Discharge: 2016-08-30 | Disposition: A | Discharge status: Home / Self Care | Payer: BLUE CROSS/BLUE SHIELD | Source: Ambulatory Visit | Attending: Internal Medicine | Admitting: Internal Medicine

## 2016-08-30 ENCOUNTER — Ambulatory Visit (HOSPITAL_COMMUNITY)
Admission: RE | Admit: 2016-08-30 | Discharge: 2016-08-30 | Disposition: A | Discharge status: Home / Self Care | Payer: BLUE CROSS/BLUE SHIELD | Source: Ambulatory Visit | Attending: Internal Medicine | Admitting: Internal Medicine

## 2016-08-30 DIAGNOSIS — R079 Chest pain, unspecified: Secondary | ICD-10-CM | POA: Diagnosis not present

## 2016-08-30 DIAGNOSIS — R1012 Left upper quadrant pain: Secondary | ICD-10-CM | POA: Diagnosis not present

## 2016-08-30 DIAGNOSIS — R932 Abnormal findings on diagnostic imaging of liver and biliary tract: Secondary | ICD-10-CM | POA: Insufficient documentation

## 2016-08-30 DIAGNOSIS — R161 Splenomegaly, not elsewhere classified: Secondary | ICD-10-CM | POA: Insufficient documentation

## 2016-08-30 DIAGNOSIS — Q8909 Congenital malformations of spleen: Secondary | ICD-10-CM | POA: Insufficient documentation

## 2016-08-30 NOTE — Telephone Encounter (Signed)
-----   Message from Corwin Levins, MD sent at 08/30/2016 11:25 AM EDT ----- Swathi Dauphin to let pt know -   U/s does show some spleen enlargement, which is an unusual problem but may be the cause of his pain  We need lab work (I will order) today if possible, and we'll need to consider further testing such as CT scan of the abd and/or referral to Hematology

## 2016-08-30 NOTE — Telephone Encounter (Signed)
Patient was called, Shawn Peters.   Per Dr. Jonny Ruiz 2nd result note: The test results show that your current treatment is OK, except for the finding of two rib fractures on the left side. This is surprising, but would also explain the pain more than even the enlarged spleen found on the ultrasound. This is most likely due to the trauma you had at work.. There is no specific treatment for this except for pain control, and you should be given "light duty" at work for at least 3 weeks. I will also enclose a work Physicist, medical.

## 2016-08-31 NOTE — Telephone Encounter (Signed)
Patient called back. I informed him of the notes. He understood.

## 2016-09-07 ENCOUNTER — Ambulatory Visit: Payer: BLUE CROSS/BLUE SHIELD | Admitting: Adult Health

## 2017-01-02 ENCOUNTER — Other Ambulatory Visit: Payer: Self-pay | Admitting: Internal Medicine

## 2017-01-02 NOTE — Telephone Encounter (Signed)
faxed

## 2017-01-02 NOTE — Telephone Encounter (Signed)
Done hardcopy to Shirron  

## 2017-01-08 ENCOUNTER — Other Ambulatory Visit: Payer: Self-pay | Admitting: Internal Medicine

## 2017-06-03 ENCOUNTER — Other Ambulatory Visit: Payer: Self-pay | Admitting: Internal Medicine

## 2017-06-29 ENCOUNTER — Other Ambulatory Visit: Payer: Self-pay | Admitting: Internal Medicine

## 2017-07-01 NOTE — Telephone Encounter (Signed)
Done erx 

## 2017-10-03 ENCOUNTER — Other Ambulatory Visit: Payer: Self-pay | Admitting: Internal Medicine

## 2017-10-07 ENCOUNTER — Ambulatory Visit: Payer: BLUE CROSS/BLUE SHIELD | Admitting: Internal Medicine

## 2017-10-07 ENCOUNTER — Encounter: Payer: Self-pay | Admitting: Internal Medicine

## 2017-10-07 ENCOUNTER — Other Ambulatory Visit (INDEPENDENT_AMBULATORY_CARE_PROVIDER_SITE_OTHER): Payer: BLUE CROSS/BLUE SHIELD

## 2017-10-07 VITALS — BP 158/92 | HR 102 | Temp 98.7°F | Ht 67.0 in | Wt 140.0 lb

## 2017-10-07 DIAGNOSIS — F1090 Alcohol use, unspecified, uncomplicated: Secondary | ICD-10-CM | POA: Insufficient documentation

## 2017-10-07 DIAGNOSIS — Z0000 Encounter for general adult medical examination without abnormal findings: Secondary | ICD-10-CM

## 2017-10-07 DIAGNOSIS — Z0001 Encounter for general adult medical examination with abnormal findings: Secondary | ICD-10-CM

## 2017-10-07 DIAGNOSIS — Z789 Other specified health status: Secondary | ICD-10-CM | POA: Diagnosis not present

## 2017-10-07 DIAGNOSIS — R768 Other specified abnormal immunological findings in serum: Secondary | ICD-10-CM

## 2017-10-07 DIAGNOSIS — F17200 Nicotine dependence, unspecified, uncomplicated: Secondary | ICD-10-CM

## 2017-10-07 DIAGNOSIS — I10 Essential (primary) hypertension: Secondary | ICD-10-CM

## 2017-10-07 DIAGNOSIS — Z114 Encounter for screening for human immunodeficiency virus [HIV]: Secondary | ICD-10-CM

## 2017-10-07 DIAGNOSIS — R7302 Impaired glucose tolerance (oral): Secondary | ICD-10-CM | POA: Diagnosis not present

## 2017-10-07 DIAGNOSIS — D7589 Other specified diseases of blood and blood-forming organs: Secondary | ICD-10-CM

## 2017-10-07 DIAGNOSIS — Z7289 Other problems related to lifestyle: Secondary | ICD-10-CM

## 2017-10-07 HISTORY — DX: Essential (primary) hypertension: I10

## 2017-10-07 LAB — CBC WITH DIFFERENTIAL/PLATELET
BASOS PCT: 0.7 % (ref 0.0–3.0)
Basophils Absolute: 0 10*3/uL (ref 0.0–0.1)
EOS PCT: 1.8 % (ref 0.0–5.0)
Eosinophils Absolute: 0.1 10*3/uL (ref 0.0–0.7)
HCT: 38 % — ABNORMAL LOW (ref 39.0–52.0)
Hemoglobin: 13.4 g/dL (ref 13.0–17.0)
LYMPHS ABS: 1 10*3/uL (ref 0.7–4.0)
Lymphocytes Relative: 19.1 % (ref 12.0–46.0)
MCHC: 35.3 g/dL (ref 30.0–36.0)
MCV: 118.4 fl — ABNORMAL HIGH (ref 78.0–100.0)
MONO ABS: 0.4 10*3/uL (ref 0.1–1.0)
MONOS PCT: 7.6 % (ref 3.0–12.0)
NEUTROS ABS: 3.8 10*3/uL (ref 1.4–7.7)
NEUTROS PCT: 70.8 % (ref 43.0–77.0)
Platelets: 60 10*3/uL — ABNORMAL LOW (ref 150.0–400.0)
RBC: 3.21 Mil/uL — AB (ref 4.22–5.81)
RDW: 12.3 % (ref 11.5–15.5)
WBC: 5.3 10*3/uL (ref 4.0–10.5)

## 2017-10-07 LAB — BASIC METABOLIC PANEL
BUN: 12 mg/dL (ref 6–23)
CALCIUM: 8.8 mg/dL (ref 8.4–10.5)
CO2: 30 meq/L (ref 19–32)
Chloride: 101 mEq/L (ref 96–112)
Creatinine, Ser: 0.94 mg/dL (ref 0.40–1.50)
GFR: 87.87 mL/min (ref >60.00)
GLUCOSE: 214 mg/dL — AB (ref 70–99)
POTASSIUM: 4.9 meq/L (ref 3.5–5.1)
SODIUM: 138 meq/L (ref 135–145)

## 2017-10-07 LAB — LIPID PANEL
CHOL/HDL RATIO: 3
Cholesterol: 131 mg/dL (ref 0–200)
HDL: 42.6 mg/dL (ref >39.00)
LDL Cholesterol: 69 mg/dL (ref 0–99)
NONHDL: 88.69
TRIGLYCERIDES: 100 mg/dL (ref 0.0–149.0)
VLDL: 20 mg/dL (ref 0.0–40.0)

## 2017-10-07 LAB — URINALYSIS, ROUTINE W REFLEX MICROSCOPIC
Hgb urine dipstick: NEGATIVE
LEUKOCYTES UA: NEGATIVE
Nitrite: NEGATIVE
PH: 6.5 (ref 5.0–8.0)
RBC / HPF: NONE SEEN (ref 0–?)
Specific Gravity, Urine: 1.025 (ref 1.000–1.030)
UROBILINOGEN UA: 0.2 (ref 0.0–1.0)
Urine Glucose: NEGATIVE

## 2017-10-07 LAB — HEPATIC FUNCTION PANEL
ALBUMIN: 3.5 g/dL (ref 3.5–5.2)
ALT: 28 U/L (ref 0–53)
AST: 57 U/L — AB (ref 0–37)
Alkaline Phosphatase: 130 U/L — ABNORMAL HIGH (ref 39–117)
BILIRUBIN TOTAL: 1.5 mg/dL — AB (ref 0.2–1.2)
Bilirubin, Direct: 0.6 mg/dL — ABNORMAL HIGH (ref 0.0–0.3)
Total Protein: 6.9 g/dL (ref 6.0–8.3)

## 2017-10-07 LAB — HEMOGLOBIN A1C: Hgb A1c MFr Bld: 4.9 % (ref 4.6–6.5)

## 2017-10-07 MED ORDER — METOPROLOL TARTRATE 25 MG PO TABS: ORAL_TABLET | ORAL | 3 refills | Status: DC

## 2017-10-07 MED ORDER — LEVOTHYROXINE SODIUM 50 MCG PO TABS: ORAL_TABLET | ORAL | 3 refills | Status: DC

## 2017-10-07 MED ORDER — CLONAZEPAM 1 MG PO TABS: ORAL_TABLET | ORAL | 5 refills | Status: DC

## 2017-10-07 NOTE — Assessment & Plan Note (Addendum)
Mild uncontrolled, o/w stable overall by history and exam, recent data reviewed with pt, and pt to continue medical treatment as before,  to f/u any worsening symptoms or concerns BP Readings from Last 3 Encounters:  10/07/17 (!) 158/92  08/29/16 140/84  08/23/16 138/83   In addition to the time spent performing CPE, I spent an additional 25 minutes face to face,in which greater than 50% of this time was spent in counseling and coordination of care for patient's acute illness as documented, including the differential dx, treatment, further evaluation and other management of HTN, Hep C pos, hyperglycemia, macrocytosis, smoking, ETOH use

## 2017-10-07 NOTE — Assessment & Plan Note (Signed)
Not b12 or med related, has hx of transaminitis - ? Liver dz, for f/u lab

## 2017-10-07 NOTE — Progress Notes (Signed)
Subjective:    Patient ID: Shawn Peters, male    DOB: 1960-12-14, 57 y.o.   MRN: [id number]  HPI  Here for wellness and f/u;  Overall doing ok;  Pt denies Chest pain, worsening SOB, DOE, wheezing, orthopnea, PND, worsening LE edema, palpitations, dizziness or syncope.  Pt denies neurological change such as new headache, facial or extremity weakness.  Pt denies polydipsia, polyuria, or low sugar symptoms. Pt states overall good compliance with treatment and medications, good tolerability, and has been trying to follow appropriate diet.  Pt denies worsening depressive symptoms, suicidal ideation or panic. No fever, night sweats, wt loss, loss of appetite, or other constitutional symptoms.  Pt states good ability with ADL's, has low fall risk, home safety reviewed and adequate, no other significant changes in hearing or vision, and not active with exercise.  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  Has 2 mo worsening hand pain improved with tumeric.  Remains with smoking and ETOH use, vague on quantity.  Pt continues to have recurring LBP without change in severity, bowel or bladder change, fever, wt loss,  worsening LE pain/numbness/weakness, gait change or falls.  States BP at home < 140/90, does not want med change Past Medical History:  Diagnosis Date  . Acute pancreatitis 2008  . ANXIETY 05/07/2008  . Erectile dysfunction 11/30/2010  . HYPERLIPIDEMIA 05/07/2008  . Hypertension 10/07/2017  . HYPOTHYROIDISM 05/07/2008  . 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 Thrombocytopenia, unspecified (HCC) 06/30/2013   Past Surgical History:  Procedure Laterality Date  . s/p left fourth finger tendon surgury  1998    reports that he has been smoking.  He has been smoking about 1.00 pack per day. He has never used smokeless tobacco. He reports that he drinks about 12.6 oz of alcohol per week. He reports that he does not use drugs. family history includes Cancer in his other; Diabetes in  his other; Heart disease in his other. Allergies  Allergen Reactions  . Penicillins    Current Outpatient Medications on File Prior to Visit  Medication Sig Dispense Refill  . triamcinolone cream (KENALOG) 0.1 % Apply 1 application topically 2 (two) times daily. 30 g 0   No current facility-administered medications on file prior to visit.    Review of Systems Constitutional: Negative for other unusual diaphoresis, sweats, appetite or weight changes HENT: Negative for other worsening hearing loss, ear pain, facial swelling, mouth sores or neck stiffness.   Eyes: Negative for other worsening pain, redness or other visual disturbance.  Respiratory: Negative for other stridor or swelling Cardiovascular: Negative for other palpitations or other chest pain  Gastrointestinal: Negative for worsening diarrhea or loose stools, blood in stool, distention or other pain Genitourinary: Negative for hematuria, flank pain or other change in urine volume.  Musculoskeletal: Negative for myalgias or other joint swelling.  Skin: Negative for other color change, or other wound or worsening drainage.  Neurological: Negative for other syncope or numbness. Hematological: Negative for other adenopathy or swelling Psychiatric/Behavioral: Negative for hallucinations, other worsening agitation, SI, self-injury, or new decreased concentration All other system neg per pt    Objective:   Physical Exam BP (!) 158/92   Pulse (!) 102   Temp 98.7 F (37.1 C) (Oral)   Ht  (1.702 m)   Wt 140 lb (63.5 kg)   SpO2 96%   BMI 21.93 kg/m  VS noted,  Constitutional: Pt is oriented to person, place, and time. Appears well-developed and  well-nourished, in no significant distress and comfortable Head: Normocephalic and atraumatic  Eyes: Conjunctivae and EOM are normal. Pupils are equal, round, and reactive to light Right Ear: External ear normal without discharge Left Ear: External ear normal without  discharge Nose: Nose without discharge or deformity Mouth/Throat: Oropharynx is without other ulcerations and moist  Neck: Normal range of motion. Neck supple. No JVD present. No tracheal deviation present or significant neck LA or mass Cardiovascular: Normal rate, regular rhythm, normal heart sounds and intact distal pulses.   Pulmonary/Chest: WOB normal and breath sounds without rales or wheezing  Abdominal: Soft. Bowel sounds are normal. NT. No HSM  Musculoskeletal: Normal range of motion. Exhibits no edema Lymphadenopathy: Has no other cervical adenopathy.  Neurological: Pt is alert and oriented to person, place, and time. Pt has normal reflexes. No cranial nerve deficit. Motor grossly intact, Gait intact Skin: Skin is warm and dry. No rash noted or new ulcerations Psychiatric:  Has normal mood and affect. Behavior is normal without agitation No other exam findings    Assessment & Plan:

## 2017-10-07 NOTE — Assessment & Plan Note (Signed)

## 2017-10-07 NOTE — Assessment & Plan Note (Signed)
Urged to quit, declines chantix 

## 2017-10-07 NOTE — Assessment & Plan Note (Signed)
stable overall by history and exam, recent data reviewed with pt, and pt to continue medical treatment as before,  to f/u any worsening symptoms or concerns Lab Results  Component Value Date   HGBA1C 4.7 09/30/2014

## 2017-10-07 NOTE — Patient Instructions (Signed)
Please continue to monitor your blood pressure on a regular basis, with the goal to be less than 140/90  Please continue all other medications as before, and refills have been done if requested.  Please have the pharmacy call with any other refills you may need.  Please continue your efforts at being more active, low cholesterol diet, and weight control.  You are otherwise up to date with prevention measures today.  Please keep your appointments with your specialists as you may have planned  Please go to the LAB in the Basement (turn left off the elevator) for the tests to be done today  You will be contacted by phone if any changes need to be made immediately.  Otherwise, you will receive a letter about your results with an explanation, but please check with MyChart first.  Please remember to sign up for MyChart if you have not done so, as this will be important to you in the future with finding out test results, communicating by private email, and scheduling acute appointments online when needed.  Please return in 1 year for your yearly visit, or sooner if needed, with Lab testing done 3-5 days before

## 2017-10-07 NOTE — Assessment & Plan Note (Signed)
Urged to quit 

## 2017-10-07 NOTE — Assessment & Plan Note (Signed)
Chart review shows + testing, will recheck for Hep C quant RNA

## 2017-10-08 LAB — HIV ANTIBODY (ROUTINE TESTING W REFLEX): HIV: NONREACTIVE

## 2017-10-08 LAB — HCV RNA QUANT: Hepatitis C Quantitation: NOT DETECTED IU/mL

## 2017-10-08 LAB — PSA: PSA: 0.3 ng/mL (ref 0.10–4.00)

## 2017-10-08 LAB — TSH: TSH: 1.74 u[IU]/mL (ref 0.35–4.50)

## 2017-11-25 ENCOUNTER — Emergency Department (HOSPITAL_COMMUNITY)
Admission: EM | Admit: 2017-11-25 | Discharge: 2017-11-25 | Disposition: A | Discharge status: Home / Self Care | Payer: BLUE CROSS/BLUE SHIELD | Attending: Emergency Medicine | Admitting: Emergency Medicine

## 2017-11-25 ENCOUNTER — Other Ambulatory Visit: Payer: Self-pay

## 2017-11-25 ENCOUNTER — Encounter (HOSPITAL_COMMUNITY): Payer: Self-pay | Admitting: Obstetrics and Gynecology

## 2017-11-25 DIAGNOSIS — Z79899 Other long term (current) drug therapy: Secondary | ICD-10-CM | POA: Diagnosis not present

## 2017-11-25 DIAGNOSIS — E039 Hypothyroidism, unspecified: Secondary | ICD-10-CM | POA: Insufficient documentation

## 2017-11-25 DIAGNOSIS — R1084 Generalized abdominal pain: Secondary | ICD-10-CM | POA: Diagnosis not present

## 2017-11-25 DIAGNOSIS — R109 Unspecified abdominal pain: Secondary | ICD-10-CM

## 2017-11-25 DIAGNOSIS — F17210 Nicotine dependence, cigarettes, uncomplicated: Secondary | ICD-10-CM | POA: Diagnosis not present

## 2017-11-25 DIAGNOSIS — I10 Essential (primary) hypertension: Secondary | ICD-10-CM | POA: Diagnosis not present

## 2017-11-25 LAB — URINALYSIS, ROUTINE W REFLEX MICROSCOPIC
Bilirubin Urine: NEGATIVE
Glucose, UA: NEGATIVE mg/dL
HGB URINE DIPSTICK: NEGATIVE
KETONES UR: 5 mg/dL — AB
Leukocytes, UA: NEGATIVE
NITRITE: NEGATIVE
PH: 5 (ref 5.0–8.0)
Protein, ur: NEGATIVE mg/dL
SPECIFIC GRAVITY, URINE: 1.023 (ref 1.005–1.030)

## 2017-11-25 LAB — BASIC METABOLIC PANEL
Anion gap: 7 (ref 5–15)
BUN: 12 mg/dL (ref 6–20)
CALCIUM: 8.2 mg/dL — AB (ref 8.9–10.3)
CHLORIDE: 104 mmol/L (ref 98–111)
CO2: 26 mmol/L (ref 22–32)
CREATININE: 0.8 mg/dL (ref 0.61–1.24)
GFR calc Af Amer: >60 mL/min (ref >60)
GFR calc non Af Amer: >60 mL/min (ref >60)
GLUCOSE: 121 mg/dL — AB (ref 70–99)
Potassium: 3.8 mmol/L (ref 3.5–5.1)
Sodium: 137 mmol/L (ref 135–145)

## 2017-11-25 LAB — CBC
HCT: 33 % — ABNORMAL LOW (ref 39.0–52.0)
Hemoglobin: 11.9 g/dL — ABNORMAL LOW (ref 13.0–17.0)
MCH: 41 pg — AB (ref 26.0–34.0)
MCHC: 36.1 g/dL — AB (ref 30.0–36.0)
MCV: 113.8 fL — AB (ref 78.0–100.0)
PLATELETS: 52 10*3/uL — AB (ref 150–400)
RBC: 2.9 MIL/uL — ABNORMAL LOW (ref 4.22–5.81)
RDW: 11.8 % (ref 11.5–15.5)
WBC: 3.6 10*3/uL — ABNORMAL LOW (ref 4.0–10.5)

## 2017-11-25 MED ORDER — IBUPROFEN 600 MG PO TABS: 600.0000 mg | ORAL_TABLET | Freq: Four times a day (QID) | ORAL | 0 refills | Status: AC | PRN

## 2017-11-25 MED ORDER — METHOCARBAMOL 750 MG PO TABS: 750.0000 mg | ORAL_TABLET | Freq: Four times a day (QID) | ORAL | 0 refills | Status: AC

## 2017-11-25 NOTE — ED Triage Notes (Signed)
Pt reports pain in his left flank starting this morning. Pt denies taking any pain medication. Pt reports a constant ache in his left side. Pt denies any urinary symptoms at this time.

## 2017-11-25 NOTE — ED Provider Notes (Signed)
[facility]-EMERGENCY DEPT Provider Note   CSN: [id number] Arrival date & time: 11/25/17  1138     History   Chief Complaint Chief Complaint  Patient presents with  . Flank Pain    HPI Shawn Peters is a 57 y.o. male.  57 year old male presents with left-sided flank pain that began this morning characterizes constant and localized to his left flank and does not radiate down his leg.  Pain is worse with certain movements.  No associated dysuria or hematuria.  No cough or shortness of breath.  Pain better with remaining still.  No prior history of renal colic.  No treatment used prior to arrival.  Nothing to symptoms better.     Past Medical History:  Diagnosis Date  . Acute pancreatitis 2008  . ANXIETY 05/07/2008  . Erectile dysfunction 11/30/2010  . HYPERLIPIDEMIA 05/07/2008  . Hypertension 10/07/2017  . HYPOTHYROIDISM 05/07/2008  . 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 Thrombocytopenia, unspecified (HCC) 06/30/2013    Patient Active Problem List   Diagnosis Date Noted  . Hypertension 10/07/2017  . Alcohol use 10/07/2017  . Chest pain 08/29/2016  . LUQ pain 08/29/2016  . Cough 08/30/2015  . Impaired glucose tolerance 06/30/2013  . Thrombocytopenia, unspecified (HCC) 06/30/2013  . Smoker 06/28/2011  . 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 Hepatitis C antibody test positive 12/01/2010  . Macrocytosis without anemia 11/30/2010  . Transaminitis 11/30/2010  . Depression 11/30/2010  . Erectile dysfunction 11/30/2010  . Encounter for well adult exam with abnormal findings 11/29/2010  . HYPOTHYROIDISM 05/07/2008  . HYPERLIPIDEMIA 05/07/2008  . ANXIETY 05/07/2008    Past Surgical History:  Procedure Laterality Date  . s/p left fourth finger tendon surgury  1998        Home Medications    Prior to Admission medications   Medication Sig Start Date End Date Taking? Authorizing Provider  clonazePAM (KLONOPIN) 1 MG tablet TAKE 1/2 TO 1 TABLET BY MOUTH TWICE A  DAY AS NEEDED. Patient taking differently: Take 1 mg by mouth daily.  10/07/17  Yes Corwin Levins, MD  Cyanocobalamin (VITAMIN B-12 PO) Take 1 tablet by mouth at bedtime.   Yes [provider]  GLUCOSAMINE HCL PO Take 1 tablet by mouth 2 (two) times daily.   Yes [provider]  levothyroxine (SYNTHROID, LEVOTHROID) 50 MCG tablet TAKE 1 TABLET (50 MCG TOTAL) BY MOUTH DAILY. 10/07/17  Yes Corwin Levins, MD  metoprolol tartrate (LOPRESSOR) 25 MG tablet TAKE 1/2 TO 1 TABLET BY MOUTH ONCE DAILY AS NEEDED Patient taking differently: Take 12.5 mg by mouth daily.  10/07/17  Yes Corwin Levins, MD  Multiple Vitamin (MULTIVITAMIN WITH MINERALS) TABS tablet Take 1 tablet by mouth at bedtime.   Yes [provider]    Family History Family History  Problem Relation Age of Onset  . Cancer Other        lung cancer  . Diabetes Other   . Heart disease Other     Social History Social History   Tobacco Use  . Smoking status: Current Every Day Smoker    Packs/day: 1.00  . Smokeless tobacco: Never Used  . Tobacco comment: Pt reports quit cigarettes 1 week ago  Substance Use Topics  . Alcohol use: Yes    Alcohol/week: 12.6 oz    Types: 21 Shots of liquor per week  . Drug use: No     Allergies   Lactose intolerance (gi) and Penicillins   Review of Systems Review of Systems  All other systems reviewed and are negative.    Physical Exam Updated Vital Signs BP 126/74 (BP Location: Right Arm)   Pulse 80   Temp 98 F (36.7 C) (Oral)   Resp 18   Ht 1.676 m (5\' 6" )   Wt 63.5 kg (140 lb)   SpO2 97%   BMI 22.60 kg/m   Physical Exam  Constitutional: He is oriented to person, place, and time. He appears well-developed and well-nourished.  Non-toxic appearance. No distress.  HENT:  Head: Normocephalic and atraumatic.  Eyes: Pupils are equal, round, and reactive to light. Conjunctivae, EOM and lids are normal.  Neck: Normal range of motion. Neck supple. No tracheal  deviation present. No thyroid mass present.  Cardiovascular: Normal rate, regular rhythm and normal heart sounds. Exam reveals no gallop.  No murmur heard. Pulmonary/Chest: Effort normal and breath sounds normal. No stridor. No respiratory distress. He has no decreased breath sounds. He has no wheezes. He has no rhonchi. He has no rales.  Abdominal: Soft. Normal appearance and bowel sounds are normal. He exhibits no distension. There is no tenderness. There is no rebound and no CVA tenderness.  Musculoskeletal: Normal range of motion. He exhibits no edema or tenderness.       Back:  Neurological: He is alert and oriented to person, place, and time. He has normal strength. No cranial nerve deficit or sensory deficit. GCS eye subscore is 4. GCS verbal subscore is 5. GCS motor subscore is 6.  Skin: Skin is warm and dry. No abrasion and no rash noted.  Psychiatric: He has a normal mood and affect. His speech is normal and behavior is normal.  Nursing note and vitals reviewed.    ED Treatments / Results  Labs (all labs ordered are listed, but only abnormal results are displayed) Labs Reviewed  URINALYSIS, ROUTINE W REFLEX MICROSCOPIC - Abnormal; Notable for the following components:      Result Value   Color, Urine AMBER (*)    Ketones, ur 5 (*)    All other components within normal limits  BASIC METABOLIC PANEL  CBC    EKG None  Radiology No results found.  Procedures Procedures (including critical care time)  Medications Ordered in ED Medications - No data to display   Initial Impression / Assessment and Plan / ED Course  I have reviewed the triage vital signs and the nursing notes.  Pertinent labs & imaging results that were available during my care of the patient were reviewed by me and considered in my medical decision making (see chart for details).     Patient with likely muscular skeletal back pain.  Urinalysis negative for infection or blood.  Will provide  supportive care  Final Clinical Impressions(s) / ED Diagnoses   Final diagnoses:  None    ED Discharge Orders    None       Lorre NickAllen, Jaleigh Mccroskey, MD 11/25/17 1358

## 2017-11-25 NOTE — ED Triage Notes (Signed)
Pt c/o left sided flank pain that began yesterday 7/10 pain. Pt denies n/v/d. Pt denies pain or burning with urination.

## 2018-02-05 ENCOUNTER — Ambulatory Visit: Payer: BLUE CROSS/BLUE SHIELD | Admitting: Internal Medicine

## 2018-02-05 ENCOUNTER — Other Ambulatory Visit (INDEPENDENT_AMBULATORY_CARE_PROVIDER_SITE_OTHER): Payer: BLUE CROSS/BLUE SHIELD

## 2018-02-05 ENCOUNTER — Encounter: Payer: Self-pay | Admitting: Internal Medicine

## 2018-02-05 ENCOUNTER — Ambulatory Visit (INDEPENDENT_AMBULATORY_CARE_PROVIDER_SITE_OTHER)
Admission: RE | Admit: 2018-02-05 | Discharge: 2018-02-05 | Disposition: A | Discharge status: Home / Self Care | Payer: BLUE CROSS/BLUE SHIELD | Source: Ambulatory Visit | Attending: Internal Medicine | Admitting: Internal Medicine

## 2018-02-05 VITALS — BP 120/76 | HR 92 | Temp 98.2°F | Ht 66.0 in | Wt 134.0 lb

## 2018-02-05 DIAGNOSIS — R7302 Impaired glucose tolerance (oral): Secondary | ICD-10-CM

## 2018-02-05 DIAGNOSIS — R112 Nausea with vomiting, unspecified: Secondary | ICD-10-CM

## 2018-02-05 DIAGNOSIS — I10 Essential (primary) hypertension: Secondary | ICD-10-CM

## 2018-02-05 LAB — CBC WITH DIFFERENTIAL/PLATELET
BASOS ABS: 0.1 10*3/uL (ref 0.0–0.1)
Basophils Relative: 1.1 % (ref 0.0–3.0)
EOS ABS: 0.3 10*3/uL (ref 0.0–0.7)
Eosinophils Relative: 4.5 % (ref 0.0–5.0)
HCT: 40.9 % (ref 39.0–52.0)
HEMOGLOBIN: 14.3 g/dL (ref 13.0–17.0)
LYMPHS ABS: 1.4 10*3/uL (ref 0.7–4.0)
Lymphocytes Relative: 22.1 % (ref 12.0–46.0)
MCHC: 35 g/dL (ref 30.0–36.0)
MCV: 116.1 fl — ABNORMAL HIGH (ref 78.0–100.0)
MONO ABS: 0.5 10*3/uL (ref 0.1–1.0)
Monocytes Relative: 7.9 % (ref 3.0–12.0)
NEUTROS PCT: 64.4 % (ref 43.0–77.0)
Neutro Abs: 4 10*3/uL (ref 1.4–7.7)
Platelets: 74 10*3/uL — ABNORMAL LOW (ref 150.0–400.0)
RBC: 3.52 Mil/uL — ABNORMAL LOW (ref 4.22–5.81)
RDW: 12.1 % (ref 11.5–15.5)
WBC: 6.2 10*3/uL (ref 4.0–10.5)

## 2018-02-05 LAB — LIPASE: Lipase: 30 U/L (ref 11.0–59.0)

## 2018-02-05 LAB — BASIC METABOLIC PANEL
BUN: 18 mg/dL (ref 6–23)
CALCIUM: 8.5 mg/dL (ref 8.4–10.5)
CO2: 29 meq/L (ref 19–32)
Chloride: 95 mEq/L — ABNORMAL LOW (ref 96–112)
Creatinine, Ser: 0.98 mg/dL (ref 0.40–1.50)
GFR: 83.65 mL/min (ref >60.00)
GLUCOSE: 123 mg/dL — AB (ref 70–99)
Potassium: 3.4 mEq/L — ABNORMAL LOW (ref 3.5–5.1)
SODIUM: 133 meq/L — AB (ref 135–145)

## 2018-02-05 LAB — HEPATIC FUNCTION PANEL
ALK PHOS: 85 U/L (ref 39–117)
ALT: 25 U/L (ref 0–53)
AST: 53 U/L — AB (ref 0–37)
Albumin: 3.7 g/dL (ref 3.5–5.2)
BILIRUBIN DIRECT: 1.1 mg/dL — AB (ref 0.0–0.3)
BILIRUBIN TOTAL: 3.6 mg/dL — AB (ref 0.2–1.2)
Total Protein: 7.5 g/dL (ref 6.0–8.3)

## 2018-02-05 MED ORDER — ONDANSETRON HCL 4 MG PO TABS: 4.0000 mg | ORAL_TABLET | Freq: Three times a day (TID) | ORAL | 0 refills | Status: AC | PRN

## 2018-02-05 NOTE — Progress Notes (Signed)
Subjective:    Patient ID: Shawn Peters, male    DOB: [birth year], 57 y.o.   MRN: [id number]  HPI  Here with acute onset 3 days nausea/vomiting with bloating and somewhat distension, initially could not keep down any fluids but yesterday was able to drink quite a bit, and last night had some sliced pears for dinner.  Has had some indigestion as well but no constipation or diarrhea.  No fever, chills, Pt denies chest pain, increased sob or doe, wheezing, orthopnea, PND, increased LE swelling, palpitations, dizziness or syncope.   Pt denies polydipsia, polyuria Past Medical History:  Diagnosis Date  . Acute pancreatitis 2008  . ANXIETY 05/07/2008  . Erectile dysfunction 11/30/2010  . HYPERLIPIDEMIA 05/07/2008  . Hypertension 10/07/2017  . HYPOTHYROIDISM 05/07/2008  . 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 Thrombocytopenia, unspecified (HCC) 06/30/2013   Past Surgical History:  Procedure Laterality Date  . s/p left fourth finger tendon surgury  1998    reports that he has been smoking. He has been smoking about 1.00 pack per day. He has never used smokeless tobacco. He reports that he drinks about 21.0 standard drinks of alcohol per week. He reports that he does not use drugs. family history includes Cancer in his other; Diabetes in his other; Heart disease in his other. Allergies  Allergen Reactions  . Lactose Intolerance (Gi) Diarrhea  . Penicillins Rash    Childhood allergy Has patient had a PCN reaction causing immediate rash, facial/tongue/throat swelling, SOB or lightheadedness with hypotension: NO Has patient had a PCN reaction causing severe rash involving mucus membranes or skin necrosis: NO Has patient had a PCN reaction that required hospitalization: NO Has patient had a PCN reaction occurring within the last 10 years: NO If all of the above answers are "NO", then may proceed with Cephalosporin use.   Current Outpatient Medications on File Prior to Visit  Medication Sig Dispense  Refill  . clonazePAM (KLONOPIN) 1 MG tablet TAKE 1/2 TO 1 TABLET BY MOUTH TWICE A DAY AS NEEDED. (Patient taking differently: Take 1 mg by mouth daily. ) 60 tablet 5  . Cyanocobalamin (VITAMIN B-12 PO) Take 1 tablet by mouth at bedtime.    Marland Kitchen GLUCOSAMINE HCL PO Take 1 tablet by mouth 2 (two) times daily.    Marland Kitchen ibuprofen (ADVIL,MOTRIN) 600 MG tablet Take 1 tablet (600 mg total) by mouth every 6 (six) hours as needed. 30 tablet 0  . levothyroxine (SYNTHROID, LEVOTHROID) 50 MCG tablet TAKE 1 TABLET (50 MCG TOTAL) BY MOUTH DAILY. 90 tablet 3  . methocarbamol (ROBAXIN-750) 750 MG tablet Take 1 tablet (750 mg total) by mouth 4 (four) times daily. 30 tablet 0  . metoprolol tartrate (LOPRESSOR) 25 MG tablet TAKE 1/2 TO 1 TABLET BY MOUTH ONCE DAILY AS NEEDED (Patient taking differently: Take 12.5 mg by mouth daily. ) 90 tablet 3  . Multiple Vitamin (MULTIVITAMIN WITH MINERALS) TABS tablet Take 1 tablet by mouth at bedtime.     No current facility-administered medications on file prior to visit.    Review of Systems  Constitutional: Negative for other unusual diaphoresis or sweats HENT: Negative for ear discharge or swelling Eyes: Negative for other worsening visual disturbances Respiratory: Negative for stridor or other swelling  Gastrointestinal: Negative for worsening distension or other blood Genitourinary: Negative for retention or other urinary change Musculoskeletal: Negative for other MSK pain or swelling Skin: Negative for color change or other new lesions Neurological: Negative for worsening tremors and other numbness  Psychiatric/Behavioral: Negative  for worsening agitation or other fatigue All other system neg per pt    Objective:   Physical Exam BP 120/76   Pulse 92   Temp 98.2 F (36.8 C) (Oral)   Ht 5\' 6"  (1.676 m)   Wt 134 lb (60.8 kg)   SpO2 97%   BMI 21.63 kg/m  VS noted, fatigued, mild ill appearing, non toxic Constitutional: Pt appears in NAD HENT: Head: NCAT.  Right  Ear: External ear normal.  Left Ear: External ear normal.  Eyes: . Pupils are equal, round, and reactive to light. Conjunctivae and EOM are normal Nose: without d/c or deformity Neck: Neck supple. Gross normal ROM Cardiovascular: Normal rate and regular rhythm.   Pulmonary/Chest: Effort normal and breath sounds without rales or wheezing.  Abd:  Soft, NT, ND, + BS, no organomegaly - benign Neurological: Pt is alert. At baseline orientation, motor grossly intact Skin: Skin is warm. No rashes, other new lesions, no LE edema Psychiatric: Pt behavior is normal without agitation  No other exam findings    Assessment & Plan:

## 2018-02-05 NOTE — Assessment & Plan Note (Signed)
stable overall by history and exam, and pt to continue medical treatment as before,  to f/u any worsening symptoms or concerns 

## 2018-02-05 NOTE — Assessment & Plan Note (Signed)
C/w likely viral AGE now improving, for zofran prn, BRAT diet then advance as tolerated, for labs today as ordered

## 2018-02-05 NOTE — Assessment & Plan Note (Signed)
stable overall by history and exam, recent data reviewed with pt, and pt to continue medical treatment as before,  to f/u any worsening symptoms or concerns Lab Results  Component Value Date   HGBA1C 4.9 10/07/2017

## 2018-02-05 NOTE — Patient Instructions (Signed)
/  Please take all new medication as prescribed - the nausea medication  OK to start with the BRAT diet (bananas, rice, applesauce and toast) then move on the more solid foods when you are able  Please continue all other medications as before, and refills have been done if requested.  Please have the pharmacy call with any other refills you may need.  Please keep your appointments with your specialists as you may have planned  Please go to the XRAY Department in the Basement (go straight as you get off the elevator) for the x-ray testing  Please go to the LAB in the Basement (turn left off the elevator) for the tests to be done today  You will be contacted by phone if any changes need to be made immediately.  Otherwise, you will receive a letter about your results with an explanation, but please check with MyChart first.  Please remember to sign up for MyChart if you have not done so, as this will be important to you in the future with finding out test results, communicating by private email, and scheduling acute appointments online when needed.

## 2018-04-25 ENCOUNTER — Other Ambulatory Visit: Payer: Self-pay | Admitting: Internal Medicine

## 2018-04-28 NOTE — Telephone Encounter (Signed)
Done erx 

## 2018-04-28 NOTE — Telephone Encounter (Signed)
   LOV: 02/05/18 NextOV:10/13/18  Last Filled/Quantity:02/24/18 60#

## 2018-07-01 ENCOUNTER — Ambulatory Visit: Payer: Self-pay

## 2018-07-01 NOTE — Telephone Encounter (Signed)
Patient called in with c/o "leg swelling." He says "over a month ago, my ankles and feet were swollen and I noticed this past weekend the swelling up my legs to my thighs and my abdomen is swollen now. I have pain in my abdomen at night mostly a 5-6. My ankles hurt and feel tight sometimes and that's a 3-4." I asked about other symptoms, he denies SOB, calf pain or any other symptoms. He says he tried wrapping with an ace bandage, but it hurt, so he took it off. According to protocol, see PCP within 24 hours, appointment scheduled for tomorrow, 07/02/18 at 1000 with Dr. Jonny Ruiz, care advice given, patient verbalized understanding.  Reason for Disposition . [1] MODERATE leg swelling (e.g., swelling extends up to knees) AND [2] new onset or worsening  Answer Assessment - Initial Assessment Questions 1. ONSET: "When did the swelling start?" (e.g., minutes, hours, days)     Started feet and ankles 1 month, then to upper legs over the weekend 2. LOCATION: "What part of the leg is swollen?"  "Are both legs swollen or just one leg?"     Both legs from feet all the way up to thighs and abdomen 3. SEVERITY: "How bad is the swelling?" (e.g., localized; mild, moderate, severe)  - Localized - small area of swelling localized to one leg  - MILD pedal edema - swelling limited to foot and ankle, pitting edema < 1/4 inch (6 mm) deep, rest and elevation eliminate most or all swelling  - MODERATE edema - swelling of lower leg to knee, pitting edema > 1/4 inch (6 mm) deep, rest and elevation only partially reduce swelling  - SEVERE edema - swelling extends above knee, facial or hand swelling present      Severe 4. REDNESS: "Does the swelling look red or infected?"     No 5. PAIN: "Is the swelling painful to touch?" If so, ask: "How painful is it?"   (Scale 1-10; mild, moderate or severe)     Yes, abdominal pain 5-6; ankles hurt occasionally a 3-4 6. FEVER: "Do you have a fever?" If so, ask: "What is it, how was it  measured, and when did it start?"      No 7. CAUSE: "What do you think is causing the leg swelling?"     No idea 8. MEDICAL HISTORY: "Do you have a history of heart failure, kidney disease, liver failure, or cancer?"     No 9. RECURRENT SYMPTOM: "Have you had leg swelling before?" If so, ask: "When was the last time?" "What happened that time?"     5[place]6 [place] ago at the beach feet and ankles; used ace bandages and it worked 10. OTHER SYMPTOMS: "Do you have any other symptoms?" (e.g., chest pain, difficulty breathing)       No 11. PREGNANCY: "Is there any chance you are pregnant?" "When was your last menstrual period?"       N/A  Protocols used: LEG SWELLING AND EDEMA-A-AH

## 2018-07-02 ENCOUNTER — Encounter: Payer: Self-pay | Admitting: Internal Medicine

## 2018-07-02 ENCOUNTER — Ambulatory Visit (INDEPENDENT_AMBULATORY_CARE_PROVIDER_SITE_OTHER): Payer: BLUE CROSS/BLUE SHIELD | Admitting: Internal Medicine

## 2018-07-02 VITALS — BP 122/64 | HR 99 | Temp 98.9°F | Ht 66.0 in | Wt 156.0 lb

## 2018-07-02 DIAGNOSIS — R198 Other specified symptoms and signs involving the digestive system and abdomen: Secondary | ICD-10-CM | POA: Diagnosis not present

## 2018-07-02 DIAGNOSIS — R7302 Impaired glucose tolerance (oral): Secondary | ICD-10-CM | POA: Diagnosis not present

## 2018-07-02 DIAGNOSIS — R609 Edema, unspecified: Secondary | ICD-10-CM | POA: Diagnosis not present

## 2018-07-02 DIAGNOSIS — R188 Other ascites: Secondary | ICD-10-CM | POA: Diagnosis not present

## 2018-07-02 MED ORDER — FUROSEMIDE 20 MG PO TABS: 20.0000 mg | ORAL_TABLET | Freq: Every day | ORAL | 11 refills | Status: DC

## 2018-07-02 NOTE — Assessment & Plan Note (Addendum)
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

## 2018-07-02 NOTE — Assessment & Plan Note (Signed)
Likely due to third spacing as with probable ascites; for lasix 20 qd

## 2018-07-02 NOTE — Progress Notes (Signed)
Subjective:    Patient ID: Shawn Peters, male    DOB: [birth year], 58 y.o.   MRN: [id number]  HPI Here to f/u with c/o occasional abd pain assoc with abd distension and wt gain, mostly LUQ, occas RLQ, no n/v, fever, not constipation, diarrhea, no blood.  Drinks 2-3 drinks per day for many years  Currently taking cephalexin for dental concerns.  Has also financial concern, as he finds today visit is considered Out of Network with his insurance.  Pt denies chest pain, increased sob or doe, wheezing, orthopnea, PND, palpitations, dizziness or syncope.   Wt Readings from Last 3 Encounters:  07/02/18 156 lb (70.8 kg)  02/05/18 134 lb (60.8 kg)  11/25/17 140 lb (63.5 kg)   Past Medical History:  Diagnosis Date  . Acute pancreatitis 2008  . ANXIETY 05/07/2008  . Erectile dysfunction 11/30/2010  . HYPERLIPIDEMIA 05/07/2008  . Hypertension 10/07/2017  . HYPOTHYROIDISM 05/07/2008  . 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 Thrombocytopenia, unspecified (HCC) 06/30/2013   Past Surgical History:  Procedure Laterality Date  . s/p left fourth finger tendon surgury  1998    reports that he has been smoking. He has been smoking about 1.00 pack per day. He has never used smokeless tobacco. He reports current alcohol use of about 21.0 standard drinks of alcohol per week. He reports that he does not use drugs. family history includes Cancer in an other family member; Diabetes in an other family member; Heart disease in an other family member. Allergies  Allergen Reactions  . Lactose Intolerance (Gi) Diarrhea  . Penicillins Rash    Childhood allergy Has patient had a PCN reaction causing immediate rash, facial/tongue/throat swelling, SOB or lightheadedness with hypotension: NO Has patient had a PCN reaction causing severe rash involving mucus membranes or skin necrosis: NO Has patient had a PCN reaction that required hospitalization: NO Has patient had a PCN reaction occurring within the last 10 years: NO If all  of the above answers are "NO", then may proceed with Cephalosporin use.   Current Outpatient Medications on File Prior to Visit  Medication Sig Dispense Refill  . clonazePAM (KLONOPIN) 1 MG tablet TAKE 1/2 TO 1 TABLET BY MOUTH TWICE A DAY AS NEEDED. 60 tablet 5  . Cyanocobalamin (VITAMIN B-12 PO) Take 1 tablet by mouth at bedtime.    Marland Kitchen GLUCOSAMINE HCL PO Take 1 tablet by mouth 2 (two) times daily.    Marland Kitchen ibuprofen (ADVIL,MOTRIN) 600 MG tablet Take 1 tablet (600 mg total) by mouth every 6 (six) hours as needed. 30 tablet 0  . levothyroxine (SYNTHROID, LEVOTHROID) 50 MCG tablet TAKE 1 TABLET (50 MCG TOTAL) BY MOUTH DAILY. 90 tablet 3  . methocarbamol (ROBAXIN-750) 750 MG tablet Take 1 tablet (750 mg total) by mouth 4 (four) times daily. 30 tablet 0  . metoprolol tartrate (LOPRESSOR) 25 MG tablet TAKE 1/2 TO 1 TABLET BY MOUTH ONCE DAILY AS NEEDED (Patient taking differently: Take 12.5 mg by mouth daily. ) 90 tablet 3  . Multiple Vitamin (MULTIVITAMIN WITH MINERALS) TABS tablet Take 1 tablet by mouth at bedtime.    . ondansetron (ZOFRAN) 4 MG tablet Take 1 tablet (4 mg total) by mouth every 8 (eight) hours as needed for nausea or vomiting. 20 tablet 0   No current facility-administered medications on file prior to visit.    Review of Systems  Constitutional: Negative for other unusual diaphoresis or sweats HENT: Negative for ear discharge or swelling Eyes: Negative for other worsening visual disturbances  Respiratory: Negative for stridor or other swelling  Gastrointestinal: Negative for worsening distension or other blood Genitourinary: Negative for retention or other urinary change Musculoskeletal: Negative for other MSK pain or swelling Skin: Negative for color change or other new lesions Neurological: Negative for worsening tremors and other numbness  Psychiatric/Behavioral: Negative for worsening agitation or other fatigue All other system neg pre pt    Objective:   Physical Exam BP  122/64   Pulse 99   Temp 98.9 F (37.2 C) (Oral)   Ht 5\' 6"  (1.676 m)   Wt 156 lb (70.8 kg)   SpO2 94%   BMI 25.18 kg/m  VS noted, not ill but likely some malnutrition Constitutional: Pt appears in NAD HENT: Head: NCAT.  Right Ear: External ear normal.  Left Ear: External ear normal.  Eyes: . Pupils are equal, round, and reactive to light. Conjunctivae and EOM are normal Nose: without d/c or deformity Neck: Neck supple. Gross normal ROM Cardiovascular: Normal rate and regular rhythm.   Pulmonary/Chest: Effort normal and breath sounds without rales or wheezing.  Abd:  Soft, NT, + BS, no organomegaly appreciated but mild distended and likely fluid wave, no guarding or rebound Neurological: Pt is alert. At baseline orientation, motor grossly intact Skin: Skin is warm. No rashes, other new lesions, 1-2+ bilat LE edema to knees Psychiatric: Pt behavior is normal without agitation  No other exam findings  Lab Results  Component Value Date   WBC 6.2 02/05/2018   HGB 14.3 02/05/2018   HCT 40.9 02/05/2018   PLT 74.0 (L) 02/05/2018   GLUCOSE 123 (H) 02/05/2018   CHOL 131 10/07/2017   TRIG 100.0 10/07/2017   HDL 42.60 10/07/2017   LDLCALC 69 10/07/2017   ALT 25 02/05/2018   AST 53 (H) 02/05/2018   NA 133 (L) 02/05/2018   K 3.4 (L) 02/05/2018   CL 95 (L) 02/05/2018   CREATININE 0.98 02/05/2018   BUN 18 02/05/2018   CO2 29 02/05/2018   TSH 1.74 10/07/2017   PSA 0.30 10/07/2017   HGBA1C 4.9 10/07/2017       Assessment & Plan:

## 2018-07-02 NOTE — Patient Instructions (Signed)
Please take all new medication as prescribed - the lasix 20 mg per day in the AM  You will be contacted regarding the referral for: Gastroenterology in the [place] area (as long as they are in network)  Please continue all other medications as before, and refills have been done if requested.  Please have the pharmacy call with any other refills you may need.  Please keep your appointments with your specialists as you may have planned

## 2018-07-02 NOTE — Assessment & Plan Note (Signed)
stable overall by history and exam, recent data reviewed with pt, and pt to continue medical treatment as before,  to f/u any worsening symptoms or concerns  

## 2018-07-15 ENCOUNTER — Ambulatory Visit: Payer: Self-pay | Admitting: *Deleted

## 2018-07-15 NOTE — Telephone Encounter (Signed)
Message from [place] sent at 07/15/2018 4:47 PM EST   Summary: swelling, abdominal pain    Patient called with complaints of swelling in abdomen and legs with abdominal pain. Patient states that he is on furosemide (LASIX) 20 MG tablet but it does not seem to be effective for the swelling.          Returned call to patient regarding pain and swelling in his abd and legs. Pt has has swelling in the abd and legs since the last week of January.  He is stating it is painful when he is getting up in the morning and late at night. He was prescribed lasix 20 mg but feels like this is not helping now. He denies shortness of breath.  He was referred to GI but will not see them until the middle of April. So he is requesting an appointment for pain med for this.  He takes Ibuprofen but feels like he needs more. He denies nausea, vomiting, diarrhea or constipation. No c/o's with voiding. If he starts experiencing increase in symptoms, he should be seen at an urgent care or ED. Pt voiced understanding. Appointment scheduled per pt request for Thursday.  Routing to flow at [facility] at [facility].  Reason for Disposition . [1] MODERATE pain (e.g., interferes with normal activities) AND [2] pain comes and goes (cramps) AND [3] present > 24 hours  (Exception: pain with Vomiting or Diarrhea - see that Guideline)  Answer Assessment - Initial Assessment Questions 1. LOCATION: "Where does it hurt?"      abd 2. RADIATION: "Does the pain shoot anywhere else?" (e.g., chest, back)     no 3. ONSET: "When did the pain begin?" (Minutes, hours or days ago)      Started about the end of January. 4. SUDDEN: "Gradual or sudden onset?"     sudden 5. PATTERN "Does the pain come and go, or is it constant?"    - If constant: "Is it getting better, staying the same, or worsening?"      (Note: Constant means the pain never goes away completely; most serious pain is constant and it progresses)     - If intermittent: "How  long does it last?" "Do you have pain now?"     (Note: Intermittent means the pain goes away completely between bouts)     Mild right now 6. SEVERITY: "How bad is the pain?"  (e.g., Scale 1-10; mild, moderate, or severe)    - MILD (1-3): doesn't interfere with normal activities, abdomen soft and not tender to touch     - MODERATE (4-7): interferes with normal activities or awakens from sleep, tender to touch     - SEVERE (8-10): excruciating pain, doubled over, unable to do any normal activities       Pain #8 7. RECURRENT SYMPTOM: "Have you ever had this type of abdominal pain before?" If so, ask: "When was the last time?" and "What happened that time?"      no 8. CAUSE: "What do you think is causing the abdominal pain?"     Swelling in the abd 9. RELIEVING/AGGRAVATING FACTORS: "What makes it better or worse?" (e.g., movement, antacids, bowel movement)     Nothing makes it worst or better 10. OTHER SYMPTOMS: "Has there been any vomiting, diarrhea, constipation, or urine problems?"       no  Protocols used: ABDOMINAL PAIN - MALE-A-AH

## 2018-07-16 NOTE — Telephone Encounter (Signed)
I think ok to be seen Thursday, but he may need a paracentesis if the ascites is getting tight,   He should not be taking any nsaids due to the risk of bleeding in the stomach

## 2018-07-16 NOTE — Telephone Encounter (Signed)
I have talked again with patient, he does not feel any worse today than yesterday---I have given dr Melvyn Novas note/instructions---encouraged use of acetaminaphen products like tylenol for pain management and to avoid nsaids due to risk of bleeding---pt advised if symptoms worsen before office visit tomorrow, he should go to ED--patient repeated back for understanding

## 2018-07-16 NOTE — Telephone Encounter (Signed)
Routing to dr Jonny Ruiz, you saw this patient (office visit) on 2/5, prescribed lasix---do you think patient needs to be seen sooner than Thursday (tomorrow), or do you want to make any changes in meds----please advise, I will call patient back--thanks

## 2018-07-17 ENCOUNTER — Encounter: Payer: Self-pay | Admitting: Nurse Practitioner

## 2018-07-17 ENCOUNTER — Ambulatory Visit (INDEPENDENT_AMBULATORY_CARE_PROVIDER_SITE_OTHER): Payer: BLUE CROSS/BLUE SHIELD | Admitting: Nurse Practitioner

## 2018-07-17 VITALS — BP 122/80 | HR 119 | Ht 66.0 in | Wt 155.0 lb

## 2018-07-17 DIAGNOSIS — R609 Edema, unspecified: Secondary | ICD-10-CM | POA: Diagnosis not present

## 2018-07-17 DIAGNOSIS — R140 Abdominal distension (gaseous): Secondary | ICD-10-CM | POA: Diagnosis not present

## 2018-07-17 DIAGNOSIS — R188 Other ascites: Secondary | ICD-10-CM

## 2018-07-17 DIAGNOSIS — R6883 Chills (without fever): Secondary | ICD-10-CM

## 2018-07-17 DIAGNOSIS — R1084 Generalized abdominal pain: Secondary | ICD-10-CM

## 2018-07-17 NOTE — Patient Instructions (Signed)
Please go to ER as discussed  Please keep planned GI follow up

## 2018-07-17 NOTE — Progress Notes (Signed)
Shawn Peters is a 58 y.o. male with the following history as recorded in [employer]:  Patient Active Problem List   Diagnosis Date Noted  . Increased abdominal girth 07/02/2018  . Peripheral edema 07/02/2018  . Nausea & vomiting 02/05/2018  . Hypertension 10/07/2017  . Alcohol use 10/07/2017  . Chest pain 08/29/2016  . LUQ pain 08/29/2016  . Cough 08/30/2015  . Impaired glucose tolerance 06/30/2013  . Thrombocytopenia, unspecified (HCC) 06/30/2013  . Smoker 06/28/2011  . 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 Hepatitis C antibody test positive 12/01/2010  . Macrocytosis without anemia 11/30/2010  . Transaminitis 11/30/2010  . Depression 11/30/2010  . Erectile dysfunction 11/30/2010  . Encounter for well adult exam with abnormal findings 11/29/2010  . HYPOTHYROIDISM 05/07/2008  . HYPERLIPIDEMIA 05/07/2008  . ANXIETY 05/07/2008    Current Outpatient Medications  Medication Sig Dispense Refill  . clonazePAM (KLONOPIN) 1 MG tablet TAKE 1/2 TO 1 TABLET BY MOUTH TWICE A DAY AS NEEDED. 60 tablet 5  . Cyanocobalamin (VITAMIN B-12 PO) Take 1 tablet by mouth at bedtime.    . furosemide (LASIX) 20 MG tablet Take 1 tablet (20 mg total) by mouth daily. 30 tablet 11  . GLUCOSAMINE HCL PO Take 1 tablet by mouth 2 (two) times daily.    Marland Kitchen ibuprofen (ADVIL,MOTRIN) 600 MG tablet Take 1 tablet (600 mg total) by mouth every 6 (six) hours as needed. 30 tablet 0  . levothyroxine (SYNTHROID, LEVOTHROID) 50 MCG tablet TAKE 1 TABLET (50 MCG TOTAL) BY MOUTH DAILY. 90 tablet 3  . methocarbamol (ROBAXIN-750) 750 MG tablet Take 1 tablet (750 mg total) by mouth 4 (four) times daily. 30 tablet 0  . metoprolol tartrate (LOPRESSOR) 25 MG tablet TAKE 1/2 TO 1 TABLET BY MOUTH ONCE DAILY AS NEEDED (Patient taking differently: Take 12.5 mg by mouth daily. ) 90 tablet 3  . Multiple Vitamin (MULTIVITAMIN WITH MINERALS) TABS tablet Take 1 tablet by mouth at bedtime.    . ondansetron (ZOFRAN) 4 MG tablet Take 1 tablet (4 mg  total) by mouth every 8 (eight) hours as needed for nausea or vomiting. 20 tablet 0   No current facility-administered medications for this visit.     Allergies: Lactose intolerance (gi) and Penicillins  Past Medical History:  Diagnosis Date  . Acute pancreatitis 2008  . ANXIETY 05/07/2008  . Erectile dysfunction 11/30/2010  . HYPERLIPIDEMIA 05/07/2008  . Hypertension 10/07/2017  . HYPOTHYROIDISM 05/07/2008  . 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 Thrombocytopenia, unspecified (HCC) 06/30/2013    Past Surgical History:  Procedure Laterality Date  . s/p left fourth finger tendon surgury  1998    Family History  Problem Relation Age of Onset  . Cancer Other        lung cancer  . Diabetes Other   . Heart disease Other     Social History   Tobacco Use  . Smoking status: Current Every Day Smoker    Packs/day: 1.00  . Smokeless tobacco: Never Used  . Tobacco comment: Pt reports quit cigarettes 1 week ago  Substance Use Topics  . Alcohol use: Yes    Alcohol/week: 21.0 standard drinks    Types: 21 Shots of liquor per week     Subjective:  MR Maj is here today requesting evaluation of acute complaint of abd pain, distension, saw his PCP on 2/5 with dx of ascites , peripheral edema, started on lasix 20 daily which he has been taking, however over past week the ascites and abd pain have increased  in severity, now having chills, decreased appetite as well. Describes his pain now as diffuse tight aching pain across entire abdomen. He has set up appointment with GI for this but not until mid April He denies weakness, syncope, confusion, cp, sob, bowel or bladder changes, abnormal bruising or bleeding Has been a drinker in the past, stopped about 1 month ago when he developed abd swelling.  ROS- See HPI  Objective:  Vitals:   07/17/18 0954  BP: 122/80  Pulse: (!) 119  SpO2: 95%  Weight: 155 lb (70.3 kg)  Height: 5\' 6"  (1.676 m)    General: Well developed, malnourished appearing,  in no acute distress  Skin : Warm and dry.  Head: Normocephalic and atraumatic  Eyes: Sclera and conjunctiva clear; pupils round and reactive to light; extraocular movements intact  Oropharynx: Pink, supple. No suspicious lesions  Neck: Supple Lungs: Respirations unlabored; clear to auscultation bilaterally without wheeze, rales, rhonchi  CVS exam: normal rate and regular rhythm, S1 and S2 normal.  Abdomen: Soft; nontender; distended, fluid wave; normoactive bowel sounds; no masses Extremities: No cyanosis, clubbing; 2+ BLE edema Vessels: Symmetric bilaterally  Neurologic: Alert and oriented; speech intact; face symmetrical; moves all extremities well; CNII-XII intact without focal deficit  Psychiatric: Normal mood and affect.   Assessment:  1. Abdominal distension   2. Other ascites   3. Chills   4. Edema, unspecified type   5. Generalized abdominal pain     Plan:   Due to worsening distension, pain, chills, concern for infection, we discussed transfer to ED for further E&M, he Is agreeable but wishes to go to [place] due to insurance, he will go immediately to [place] ED for fruther care now He will also keep planned f/u with GI provider Encouraged continued alcohol avoidance  No follow-ups on file.  No orders of the defined types were placed in this encounter.   Requested Prescriptions    No prescriptions requested or ordered in this encounter

## 2018-10-06 ENCOUNTER — Other Ambulatory Visit: Payer: Self-pay | Admitting: Internal Medicine

## 2018-10-13 ENCOUNTER — Encounter: Payer: BLUE CROSS/BLUE SHIELD | Admitting: Internal Medicine

## 2018-10-14 DIAGNOSIS — R188 Other ascites: Secondary | ICD-10-CM | POA: Insufficient documentation

## 2018-11-21 ENCOUNTER — Other Ambulatory Visit: Payer: Self-pay | Admitting: Internal Medicine

## 2018-12-18 ENCOUNTER — Other Ambulatory Visit: Payer: Self-pay | Admitting: Internal Medicine

## 2018-12-18 NOTE — Telephone Encounter (Signed)
Done erx 

## 2019-04-28 DIAGNOSIS — K7030 Alcoholic cirrhosis of liver without ascites: Secondary | ICD-10-CM | POA: Insufficient documentation

## 2019-04-28 HISTORY — PX: TIPS PROCEDURE: SHX808

## 2019-04-29 DIAGNOSIS — E871 Hypo-osmolality and hyponatremia: Secondary | ICD-10-CM | POA: Insufficient documentation

## 2019-06-02 ENCOUNTER — Other Ambulatory Visit: Payer: Self-pay

## 2019-06-02 ENCOUNTER — Ambulatory Visit (INDEPENDENT_AMBULATORY_CARE_PROVIDER_SITE_OTHER): Payer: 59 | Admitting: Internal Medicine

## 2019-06-02 ENCOUNTER — Encounter: Payer: Self-pay | Admitting: Internal Medicine

## 2019-06-02 VITALS — BP 124/60 | HR 80 | Temp 98.3°F | Resp 12 | Ht 66.0 in | Wt 147.8 lb

## 2019-06-02 DIAGNOSIS — R7302 Impaired glucose tolerance (oral): Secondary | ICD-10-CM

## 2019-06-02 DIAGNOSIS — I10 Essential (primary) hypertension: Secondary | ICD-10-CM | POA: Diagnosis not present

## 2019-06-02 DIAGNOSIS — K7031 Alcoholic cirrhosis of liver with ascites: Secondary | ICD-10-CM | POA: Diagnosis not present

## 2019-06-02 MED ORDER — FUROSEMIDE 40 MG PO TABS: 40.0000 mg | ORAL_TABLET | Freq: Every day | ORAL | 11 refills | Status: DC

## 2019-06-02 NOTE — Progress Notes (Signed)
Subjective:    Patient ID: Shawn Peters, male    DOB: 10/14/1960, 59 y.o.   MRN: [id number]  HPI  Here to f/u; overall doing ok,  Pt denies chest pain, increasing sob or doe, wheezing, orthopnea, PND, increased LE swelling, palpitations, dizziness or syncope.  Pt denies new neurological symptoms such as new headache, or facial or extremity weakness or numbness.  Pt denies polydipsia, polyuria, or low sugar episode.  Pt states overall good compliance with meds, mostly trying to follow appropriate diet, with wt overall stable,  but little exercise however. Due for egd and colonoscopy, as bowel surgury for what sounds like obstruction aug 2021. Quit ETOH, eating better,  S/p TIPS and paracentesis but 25 lbs wt increase and swelling since xmas.  Has new insurance, no longer seeing [facility].  Past Medical History:  Diagnosis Date  . Acute pancreatitis 2008  . ANXIETY 05/07/2008  . Erectile dysfunction 11/30/2010  . HYPERLIPIDEMIA 05/07/2008  . Hypertension 10/07/2017  . HYPOTHYROIDISM 05/07/2008  . 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 Thrombocytopenia, unspecified ([place]) 06/30/2013   Past Surgical History:  Procedure Laterality Date  . s/p left fourth finger tendon surgury  1998    reports that he has been smoking. He has been smoking about 1.00 pack per day. He has never used smokeless tobacco. He reports current alcohol use of about 21.0 standard drinks of alcohol per week. He reports that he does not use drugs. family history includes Cancer in an other family member; Diabetes in an other family member; Heart disease in an other family member. Allergies  Allergen Reactions  . Lactose Intolerance (Gi) Diarrhea  . Penicillins Rash    Childhood allergy Has patient had a PCN reaction causing immediate rash, facial/tongue/throat swelling, SOB or lightheadedness with hypotension: NO Has patient had a PCN reaction causing severe rash involving mucus membranes or skin necrosis: NO Has patient had a PCN  reaction that required hospitalization: NO Has patient had a PCN reaction occurring within the last 10 years: NO If all of the above answers are "NO", then may proceed with Cephalosporin use.   Current Outpatient Medications on File Prior to Visit  Medication Sig Dispense Refill  . clonazePAM (KLONOPIN) 1 MG tablet TAKE 1/2 TO 1 TABLET BY MOUTH TWICE A DAY AS NEEDED. 60 tablet 5  . Cyanocobalamin (VITAMIN B-12 PO) Take 1 tablet by mouth at bedtime.    Marland Kitchen GLUCOSAMINE HCL PO Take 1 tablet by mouth 2 (two) times daily.    Marland Kitchen ibuprofen (ADVIL,MOTRIN) 600 MG tablet Take 1 tablet (600 mg total) by mouth every 6 (six) hours as needed. 30 tablet 0  . levothyroxine (SYNTHROID) 50 MCG tablet TAKE 1 TABLET BY MOUTH DAILY. 90 tablet 3  . methocarbamol (ROBAXIN-750) 750 MG tablet Take 1 tablet (750 mg total) by mouth 4 (four) times daily. 30 tablet 0  . metoprolol tartrate (LOPRESSOR) 25 MG tablet TAKE 1/2 TO 1 TABLET BY MOUTH ONCE DAILY AS NEEDED 90 tablet 3  . midodrine (PROAMATINE) 10 MG tablet Take 10 mg by mouth 3 (three) times daily.     . Multiple Vitamin (MULTIVITAMIN WITH MINERALS) TABS tablet Take 1 tablet by mouth at bedtime.    . ondansetron (ZOFRAN) 4 MG tablet Take 1 tablet (4 mg total) by mouth every 8 (eight) hours as needed for nausea or vomiting. 20 tablet 0  . Pyridoxine HCl (VITAMIN B6 PO) Take by mouth.     No current facility-administered medications on file prior to visit.  Review of Systems  Constitutional: Negative for other unusual diaphoresis or sweats HENT: Negative for ear discharge or swelling Eyes: Negative for other worsening visual disturbances Respiratory: Negative for stridor or other swelling  Gastrointestinal: Negative for worsening distension or other blood Genitourinary: Negative for retention or other urinary change Musculoskeletal: Negative for other MSK pain or swelling Skin: Negative for color change or other new lesions Neurological: Negative for worsening  tremors and other numbness  Psychiatric/Behavioral: Negative for worsening agitation or other fatigue All otherwise neg per pt     Objective:   Physical Exam BP 124/60   Pulse 80   Temp 98.3 F (36.8 C)   Resp 12   Ht 5\' 6"  (1.676 m)   Wt 147 lb 12.8 oz (67 kg)   SpO2 99%   BMI 23.86 kg/m  VS noted, chronic ill appearing Constitutional: Pt appears in NAD HENT: Head: NCAT.  Right Ear: External ear normal.  Left Ear: External ear normal.  Eyes: . Pupils are equal, round, and reactive to light. Conjunctivae and EOM are normal Nose: without d/c or deformity Neck: Neck supple. Gross normal ROM Cardiovascular: Normal rate and regular rhythm.   Pulmonary/Chest: Effort normal and breath sounds without rales or wheezing.  Abd:  Soft, NT, mod distended, with ascited, + BS, no organomegaly Neurological: Pt is alert. At baseline orientation, motor grossly intact Skin: Skin is warm. No rashes, other new lesions, 2+ bilat LE edema Psychiatric: Pt behavior is normal without agitation  All otherwise neg per pt Lab Results  Component Value Date   WBC 6.2 02/05/2018   HGB 14.3 02/05/2018   HCT 40.9 02/05/2018   PLT 74.0 (L) 02/05/2018   GLUCOSE 123 (H) 02/05/2018   CHOL 131 10/07/2017   TRIG 100.0 10/07/2017   HDL 42.60 10/07/2017   LDLCALC 69 10/07/2017   ALT 25 02/05/2018   AST 53 (H) 02/05/2018   NA 133 (L) 02/05/2018   K 3.4 (L) 02/05/2018   CL 95 (L) 02/05/2018   CREATININE 0.98 02/05/2018   BUN 18 02/05/2018   CO2 29 02/05/2018   TSH 1.74 10/07/2017   PSA 0.30 10/07/2017   HGBA1C 4.9 10/07/2017      Assessment & Plan:

## 2019-06-02 NOTE — Patient Instructions (Signed)
Ok to take an extra dose lasix 40 mg tonight  Please keep your appt with [facility] tomorrow  You will be contacted regarding the referral for: GI locally as well (in the [facility])  Please continue all other medications as before=  Please have the pharmacy call with any other refills you may need.  Please continue your efforts at being more active, low cholesterol diet, and weight control.  Please keep your appointments with your specialists as you may have planned

## 2019-06-03 ENCOUNTER — Telehealth: Payer: Self-pay

## 2019-06-03 ENCOUNTER — Telehealth: Payer: Self-pay | Admitting: Gastroenterology

## 2019-06-03 NOTE — Telephone Encounter (Signed)
I am unable to accommodate a transfer of care at this time. Perhaps another [facility] physician can accommodate.

## 2019-06-03 NOTE — Telephone Encounter (Signed)
DOD 06/03/2019 Dr. Leone Payor, please see below.

## 2019-06-03 NOTE — Telephone Encounter (Signed)
Good afternoon, Dr. Russella Dar, this patient had previous seen you in 2012.  He has been under Dr. Jennye Boroughs care since 2020 but reported that his insurance has changed and is no longer contracted with [facility].   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  Please review patient's chart (available in [facility] Everywhere) and advise scheduling.

## 2019-06-03 NOTE — Telephone Encounter (Signed)
I will see the patient 

## 2019-06-03 NOTE — Telephone Encounter (Signed)
Copied from CRM [id number]. Topic: Referral - Status >> Jun 03, 2019  1:43 PM Angela Nevin wrote: Patient calling to check status of GI referral.

## 2019-06-05 ENCOUNTER — Encounter: Payer: Self-pay | Admitting: Internal Medicine

## 2019-06-05 ENCOUNTER — Telehealth: Payer: Self-pay | Admitting: Internal Medicine

## 2019-06-05 ENCOUNTER — Other Ambulatory Visit: Payer: Self-pay | Admitting: Internal Medicine

## 2019-06-05 ENCOUNTER — Ambulatory Visit (INDEPENDENT_AMBULATORY_CARE_PROVIDER_SITE_OTHER): Payer: 59 | Admitting: Internal Medicine

## 2019-06-05 VITALS — BP 120/60 | HR 98 | Temp 97.0°F | Ht 66.0 in | Wt 145.0 lb

## 2019-06-05 DIAGNOSIS — E43 Unspecified severe protein-calorie malnutrition: Secondary | ICD-10-CM | POA: Insufficient documentation

## 2019-06-05 DIAGNOSIS — K7031 Alcoholic cirrhosis of liver with ascites: Secondary | ICD-10-CM

## 2019-06-05 DIAGNOSIS — Z95828 Presence of other vascular implants and grafts: Secondary | ICD-10-CM | POA: Insufficient documentation

## 2019-06-05 NOTE — Progress Notes (Signed)
Shawn Peters 59 y.o. December 27, 1960 [id number]  Assessment & Plan:  Alcoholic cirrhosis ([place]) Difficult problem with refractory ascites though I am not sure he was on high doses of diuretics.  He is status post TIPS but it has not worked which raises the question if the TIPS is functioning properly.  Need to check the TIPS for patency and schedule a large-volume paracentesis with albumin.  Increase midodrine to 10 mg 3 times daily.  He is to contact me with the dose of spironolactone that he was on.  We will communicate with Dr. Merrilee Peters at [facility].  Update on January 14 The patient had 8 L of ascitic fluid withdrawn after this visit Cell count and differential and albumin consistent with cirrhosis no SBP  Interrogation of TIPS shows patent flow  I spoke to Dr. Merrilee Peters and he explained that the patient could have right heart failure or right ventricular dysfunction that was not evident on his pre-TIPS echocardiogram that showed overall relatively normal function, and that a repeat echocardiogram was warranted.  I will order this.  I have increased the patient's diuretics to 80 mg furosemide daily and 150 mg daily and will recheck kidney functions and electrolytes on 1/15.  My sense is that we have room to increase his diuretics, I am not convinced he was refractory to diuretics as it does not sound like he was ever maxed out on these.  We will get the echocardiogram to see if there is been a change in his heart function as well.  Protein-calorie malnutrition, severe ([place]) From advanced liver disease ascites, the ascites is compounding this by causing compression of the stomach and limiting eating.  Of asked him to try to eat several times a day or continuously and did purchase some over-the-counter nutritional supplements.  S/P TIPS (transjugular intrahepatic portosystemic shunt) This has not solved this problem with refractory ascites.  We need to interrogate the shunt to see if it is  patent.    I appreciate the opportunity to care for this patient. CC: Shawn Borg, MD Dr. Laurier Peters  Subjective:   Chief Complaint: cirrhosis and fluid retention  HPI The patient is a 59 year old white man with alcoholic cirrhosis of liver and problems with ascites, he was previously followed by Dr. Lucio Peters but when the patient requested to return to our practice related to insurance changes Shawn Peters Plan indicated he was unable to accept the patient in transfer of care.  The patient had previously been followed in [place], he wanted to return the [place] health care, he had an exchange plan through [employer] which excluded [employer], he is now with [employer] insurance which includes [employer].  I agreed to see the patient and he reports that he has had problems with progressive ascites, and underwent a TIPS procedure at the [facility] in December.  Since that time he has gained 25 to 30 pounds since then.  He does not have confusion or signs of encephalopathy.  Review of the records indicates that the TIPS was placed because he had "ascites refractory to diuretics".  When I I cannot see maximal type doses of diuretics including spironolactone and furosemide.  The patient has a long history of alcoholism but has been abstinent for some time now.  He is not able to eat well because of the increased abdominal distention and ascites.  His weight is actually lower than it was about a year ago.  He saw Dr. Laurier Peters of [facility] liver center on January 6 and Dr. Merrilee Peters thought it would be important to have interrogation of his TIPS with an ultrasound and another paracentesis.  He also wondered if the patient could have heart failure issues though the patient had a normal or no significant abnormality echocardiogram prior to his TIPS placement Dr. Ilona Peters wonders if the patient might actually have some right  heart failure or right heart failure that was precipitated by the changes in hemodynamics related to the tips.  This is per conversation on January 14 with Dr. Merrilee Peters Dr. Merrilee Peters considers the patient a possible liver transplant candidate.  He does plan to see him back.  The patient is also experienced significant lower extremity edema.  Abdominal discomfort is associated. Wt Readings from Last 3 Encounters:  06/05/19 145 lb (65.8 kg)  06/02/19 147 lb 12.8 oz (67 kg)  07/17/18 155 lb (70.3 kg)   Labs at [facility] 06/03/2019 PLT 74 WBC 10.1 Hgb 8.9 Creat 1 Bili 2.9, NL-low AST/alst Alb 1.8 Alk phos NL INR 1.3 Gluc 149 Allergies  Allergen Reactions  . Lactose Intolerance (Gi) Diarrhea  . Penicillins Rash    Childhood allergy Has patient had a PCN reaction causing immediate rash, facial/tongue/throat swelling, SOB or lightheadedness with hypotension: NO Has patient had a PCN reaction causing severe rash involving mucus membranes or skin necrosis: NO Has patient had a PCN reaction that required hospitalization: NO Has patient had a PCN reaction occurring within the last 10 years: NO If all of the above answers are "NO", then may proceed with Cephalosporin use.   Current Meds  Medication Sig  . clonazePAM (KLONOPIN) 1 MG tablet TAKE 1/2 TO 1 TABLET BY MOUTH TWICE A DAY AS NEEDED.  Marland Kitchen Cyanocobalamin (VITAMIN B-12 PO) Take 1 tablet by mouth at bedtime.  Marland Kitchen GLUCOSAMINE HCL PO Take 1 tablet by mouth 2 (two) times daily.  Marland Kitchen ibuprofen (ADVIL,MOTRIN) 600 MG tablet Take 1 tablet (600 mg total) by mouth every 6 (six) hours as needed.  Marland Kitchen levothyroxine (SYNTHROID) 50 MCG tablet TAKE 1 TABLET BY MOUTH DAILY.  . methocarbamol (ROBAXIN-750) 750 MG tablet Take 1 tablet (750 mg total) by mouth 4 (four) times daily.  . metoprolol tartrate (LOPRESSOR) 25 MG tablet TAKE 1/2 TO 1 TABLET BY MOUTH ONCE DAILY AS NEEDED  . midodrine (PROAMATINE) 10 MG tablet Take 10 mg by mouth 3 (three) times daily.   . Multiple  Vitamin (MULTIVITAMIN WITH MINERALS) TABS tablet Take 1 tablet by mouth at bedtime.  . ondansetron (ZOFRAN) 4 MG tablet Take 1 tablet (4 mg total) by mouth every 8 (eight) hours as needed for nausea or vomiting.  . Pyridoxine HCl (VITAMIN B6 PO) Take by mouth.  . [DISCONTINUED] furosemide (LASIX) 40 MG tablet Take 1 tablet (40 mg total) by mouth daily. (Patient taking differently: Take 80 mg by mouth daily. )   Past Medical History:  Diagnosis Date  . Acute pancreatitis 2008  . ANXIETY 05/07/2008  . Erectile dysfunction 11/30/2010  . HYPERLIPIDEMIA 05/07/2008  . Hypertension 10/07/2017  . HYPOTHYROIDISM 05/07/2008  . 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 Thrombocytopenia, unspecified ([place]) 06/30/2013   Past Surgical History:  Procedure Laterality Date  . IR PARACENTESIS  06/10/2019  . s/p left fourth finger tendon surgury  1998   Social History   Social History Narrative   Divorced, 1 son   Unemployed   Lives alone   1 caffeine/day   No drugs./no EtOH/former smoker no  tobacco   family history includes Cancer in an other family member; Diabetes in an other family member; Heart disease in an other family member.   Review of Systems As abone, + headaches, fatigue and itchy skin  Objective:   Physical Exam @BP  120/60   Pulse 98   Temp (!) 97 F (36.1 C)   Ht 5' 6"  (1.676 m)   Wt 145 lb (65.8 kg)   BMI 23.40 kg/m @  General:  Thin, frail, malnourished no acute distress Eyes:  Muddy / icteric sclerae ENT:   Mouth and posterior pharynx free of lesions.  Neck:   supple w/o thyromegaly or mass.  Lungs: Clear to auscultation bilaterally. Heart:   S1S2, no rubs, murmurs, gallops.- Abdomen:  MARKED ASCITES, LOW MIDLINE SCAR, NOT TENSE NOT TENDER Lymph:  no cervical or supraclavicular adenopathy. Extremities:   1+ bilat LE  edema, cyanosis or clubbing Skin   +spiders, gray . Neuro:  A&O x 3. No asterixis Psych:  appropriate mood and  Affect.   Data Reviewed: See HPI, I have reviewed  [facility] notes [facility] notes imaging labs etc. in the last year.

## 2019-06-05 NOTE — Assessment & Plan Note (Addendum)
Difficult problem with refractory ascites though I am not sure he was on high doses of diuretics.  He is status post TIPS but it has not worked which raises the question if the TIPS is functioning properly.  Need to check the TIPS for patency and schedule a large-volume paracentesis with albumin.  Increase midodrine to 10 mg 3 times daily.  He is to contact me with the dose of spironolactone that he was on.  We will communicate with Dr. Sharlene Motts at [facility].  Update on January 14 The patient had 8 L of ascitic fluid withdrawn after this visit Cell count and differential and albumin consistent with cirrhosis no SBP  Interrogation of TIPS shows patent flow  I spoke to Dr. Sharlene Motts and he explained that the patient could have right heart failure or right ventricular dysfunction that was not evident on his pre-TIPS echocardiogram that showed overall relatively normal function, and that a repeat echocardiogram was warranted.  I will order this.  I have increased the patient's diuretics to 80 mg furosemide daily and 150 mg daily and will recheck kidney functions and electrolytes on 1/15.  My sense is that we have room to increase his diuretics, I am not convinced he was refractory to diuretics as it does not sound like he was ever maxed out on these.  We will get the echocardiogram to see if there is been a change in his heart function as well.

## 2019-06-05 NOTE — Telephone Encounter (Signed)
Pt would like to speak to you regarding his medications.

## 2019-06-05 NOTE — Telephone Encounter (Signed)
  Patient called back to report that before TIPS he was on spirolactone 50mg  tablets, one TID. I will let Dr know.

## 2019-06-05 NOTE — Patient Instructions (Addendum)
Please call us back with the spirolactone dose you are taking.   Change your midodrine to 10mg  three times a day.   You have been scheduled for an abdominal paracentesis at [facility] radiology (1st floor of hospital) on 06/10/2019 at 9:00AM. Please arrive at least 15 minutes prior to your appointment time for registration. Should you need to reschedule this appointment for any reason, please call our office at [phone].  You have been scheduled for an Ultrasound liver dopper on 06/11/2019 at 10:00AM. Please arrive 15 minutes prior to your appointment for registration. Make certain not to have anything to eat or drink 6 hours prior to your appointment. Should you need to reschedule your appointment, please contact radiology at [phone]. This test typically takes about 30 minutes to perform.   I appreciate the opportunity to care for you. [phone], MD, [facility]

## 2019-06-05 NOTE — Assessment & Plan Note (Signed)
From advanced liver disease ascites, the ascites is compounding this by causing compression of the stomach and limiting eating.  Of asked him to try to eat several times a day or continuously and did purchase some over-the-counter nutritional supplements.

## 2019-06-05 NOTE — Assessment & Plan Note (Signed)
This has not solved this problem with refractory ascites.  We need to interrogate the shunt to see if it is patent.

## 2019-06-07 ENCOUNTER — Encounter: Payer: Self-pay | Admitting: Internal Medicine

## 2019-06-07 NOTE — Assessment & Plan Note (Signed)
For lasix tonight asd, then f/u GI tomorrow as planned, likely needs repeat paracentesis soon

## 2019-06-07 NOTE — Assessment & Plan Note (Signed)
stable overall by history and exam, recent data reviewed with pt, and pt to continue medical treatment as before,  to f/u any worsening symptoms or concerns  

## 2019-06-08 NOTE — Telephone Encounter (Signed)
Have him do the following  Furosemide 80 mg (now on 40) in AM  Spironolactone 150 mg in AM

## 2019-06-08 NOTE — Telephone Encounter (Signed)
Pt said he is returning your call #[phone]

## 2019-06-08 NOTE — Telephone Encounter (Signed)
Patient informed and verbalized understanding

## 2019-06-08 NOTE — Telephone Encounter (Signed)
I have left messages on both work and cell to call me to discuss medicine changes.

## 2019-06-08 NOTE — Telephone Encounter (Signed)
Tried to call patient and phone just rang. Will try again later.

## 2019-06-08 NOTE — Telephone Encounter (Signed)
Pt said he is returning your call 

## 2019-06-10 ENCOUNTER — Ambulatory Visit (HOSPITAL_COMMUNITY)
Admission: RE | Admit: 2019-06-10 | Discharge: 2019-06-10 | Disposition: A | Discharge status: Home / Self Care | Payer: 59 | Source: Ambulatory Visit | Attending: Internal Medicine | Admitting: Internal Medicine

## 2019-06-10 ENCOUNTER — Other Ambulatory Visit: Payer: Self-pay

## 2019-06-10 DIAGNOSIS — K7031 Alcoholic cirrhosis of liver with ascites: Secondary | ICD-10-CM | POA: Diagnosis present

## 2019-06-10 HISTORY — PX: IR PARACENTESIS: IMG2679

## 2019-06-10 LAB — GRAM STAIN

## 2019-06-10 LAB — BODY FLUID CELL COUNT WITH DIFFERENTIAL
Lymphs, Fluid: 37 %
Monocyte-Macrophage-Serous Fluid: 62 % (ref 50–90)
Neutrophil Count, Fluid: 1 % (ref 0–25)
Total Nucleated Cell Count, Fluid: 26 cu mm (ref 0–1000)

## 2019-06-10 LAB — ALBUMIN, PLEURAL OR PERITONEAL FLUID?: Albumin, Fluid: <1 g/dL

## 2019-06-10 LAB — ALBUMIN, PLEURAL OR PERITONEAL FLUID

## 2019-06-10 MED ORDER — ALBUMIN HUMAN 25 % IV SOLN
INTRAVENOUS | Status: AC
  Filled 2019-06-10: qty 200

## 2019-06-10 MED ORDER — ALBUMIN HUMAN 25 % IV SOLN
50.0000 g | Freq: Once | INTRAVENOUS | Status: AC
  Administered 2019-06-10: 09:00:00 50 g via INTRAVENOUS

## 2019-06-10 MED ORDER — LIDOCAINE HCL (PF) 1 % IJ SOLN
INTRAMUSCULAR | Status: DC | PRN
  Administered 2019-06-10: 10 mL

## 2019-06-10 MED ORDER — LIDOCAINE HCL 1 % IJ SOLN
INTRAMUSCULAR | Status: AC
  Filled 2019-06-10: qty 20

## 2019-06-10 NOTE — Procedures (Signed)
PROCEDURE SUMMARY:  Successful image-guided paracentesis from the right lower abdomen.  Yielded 8.0 liters of clear yellow fluid which was the requested maximum.  No immediate complications.  EBL: zero Patient tolerated well.   Specimen was sent for labs. Patient to receive post procedure albumin administration per IR protocol.   Please see imaging section of Epic for full dictation.  Villa Herb PA-C 06/10/2019 9:06 AM

## 2019-06-11 ENCOUNTER — Encounter: Payer: Self-pay | Admitting: Internal Medicine

## 2019-06-11 ENCOUNTER — Encounter: Payer: Self-pay | Admitting: Gastroenterology

## 2019-06-11 ENCOUNTER — Other Ambulatory Visit: Payer: Self-pay

## 2019-06-11 ENCOUNTER — Ambulatory Visit (HOSPITAL_COMMUNITY)
Admission: RE | Admit: 2019-06-11 | Discharge: 2019-06-11 | Disposition: A | Discharge status: Home / Self Care | Payer: 59 | Source: Ambulatory Visit | Attending: Internal Medicine | Admitting: Internal Medicine

## 2019-06-11 DIAGNOSIS — Z95828 Presence of other vascular implants and grafts: Secondary | ICD-10-CM | POA: Diagnosis present

## 2019-06-11 DIAGNOSIS — K7031 Alcoholic cirrhosis of liver with ascites: Secondary | ICD-10-CM

## 2019-06-11 LAB — PATHOLOGIST SMEAR REVIEW

## 2019-06-12 ENCOUNTER — Other Ambulatory Visit (INDEPENDENT_AMBULATORY_CARE_PROVIDER_SITE_OTHER): Payer: 59

## 2019-06-12 ENCOUNTER — Other Ambulatory Visit: Payer: Self-pay | Admitting: *Deleted

## 2019-06-12 DIAGNOSIS — K7031 Alcoholic cirrhosis of liver with ascites: Secondary | ICD-10-CM

## 2019-06-12 LAB — BASIC METABOLIC PANEL
BUN: 10 mg/dL (ref 6–23)
CO2: 30 mEq/L (ref 19–32)
Calcium: 7.2 mg/dL — ABNORMAL LOW (ref 8.4–10.5)
Chloride: 101 mEq/L (ref 96–112)
Creatinine, Ser: 0.99 mg/dL (ref 0.40–1.50)
GFR: 77.42 mL/min (ref >60.00)
Glucose, Bld: 181 mg/dL — ABNORMAL HIGH (ref 70–99)
Potassium: 3.1 mEq/L — ABNORMAL LOW (ref 3.5–5.1)
Sodium: 139 mEq/L (ref 135–145)

## 2019-06-12 MED ORDER — POTASSIUM CHLORIDE CRYS ER 20 MEQ PO TBCR: 20.0000 meq | EXTENDED_RELEASE_TABLET | Freq: Every day | ORAL | 0 refills | Status: AC

## 2019-06-15 ENCOUNTER — Other Ambulatory Visit: Payer: Self-pay

## 2019-06-15 DIAGNOSIS — I50810 Right heart failure, unspecified: Secondary | ICD-10-CM

## 2019-06-15 LAB — CULTURE, BODY FLUID W GRAM STAIN -BOTTLE: Culture: NO GROWTH

## 2019-06-15 LAB — CULTURE, BODY FLUID-BOTTLE

## 2019-06-19 ENCOUNTER — Ambulatory Visit (HOSPITAL_COMMUNITY): Admission: RE | Admit: 2019-06-19 | Payer: 59 | Source: Ambulatory Visit

## 2019-06-28 ENCOUNTER — Telehealth: Payer: Self-pay | Admitting: Family Medicine

## 2019-06-29 ENCOUNTER — Telehealth: Payer: Self-pay | Admitting: Internal Medicine

## 2019-06-29 NOTE — Telephone Encounter (Signed)
EMS spoke with [facility] on 2019-07-15 at 11:01pm.  States that patient was found deceased.  States has been deceased 3-4 days.  States appeared to have passed due to natural causes.  EMS for transferred to on call MD.

## 2019-06-29 NOTE — Telephone Encounter (Signed)
Pt is a 59 yo male with pmh sig for EtOH cirrhosis with ascites s/p TIPS procedure, HTN, hypothyroidism, HLD, Hep C, and h/o pancreatitis followed by Oliver Barre, MD.  On call provider contacted as pt found deceased in home after welfare check by the sheriff's department.  Sheriff's dept contacted after pt had not been seen x 1 wk.  Per EMS pt found beside bed, abdominal distention noted.  No foul play suspected.   EMS inquires about death certificate.  Will release body to funeral home.  Ok to send death certificate to pcp in am.  Abbe Amsterdam, MD

## 2019-06-29 NOTE — Telephone Encounter (Signed)
Dr. Jonny Ruiz please read message below.

## 2019-06-29 DEATH — deceased

## 2019-07-01 ENCOUNTER — Telehealth: Payer: Self-pay

## 2019-07-01 NOTE — Telephone Encounter (Signed)
Funeral home came and dropped off death certificated to be signed placed in Dr. Lewie Chamber     Please advise

## 2019-07-03 NOTE — Telephone Encounter (Signed)
Death certificate has been signed and waiting for funeral home to pick up.

## 2019-07-14 ENCOUNTER — Ambulatory Visit: Payer: 59 | Admitting: Internal Medicine

## 2021-05-12 IMAGING — US IR PARACENTESIS
1 series · 2 of 2 positions shown · non-contrast
Comparison: none

INDICATION: Patient with history of alcoholic cirrhosis status post TIPS
placement 05/21/2019 at [facility]], now with
refractory ascites and weight gain. Request to IR for diagnostic and
therapeutic paracentesis with 8 L maximum and post procedure albumin
administration per IR protocol.

[Series 1: (id) · 2 of 2 slices shown]
[im 1/2]
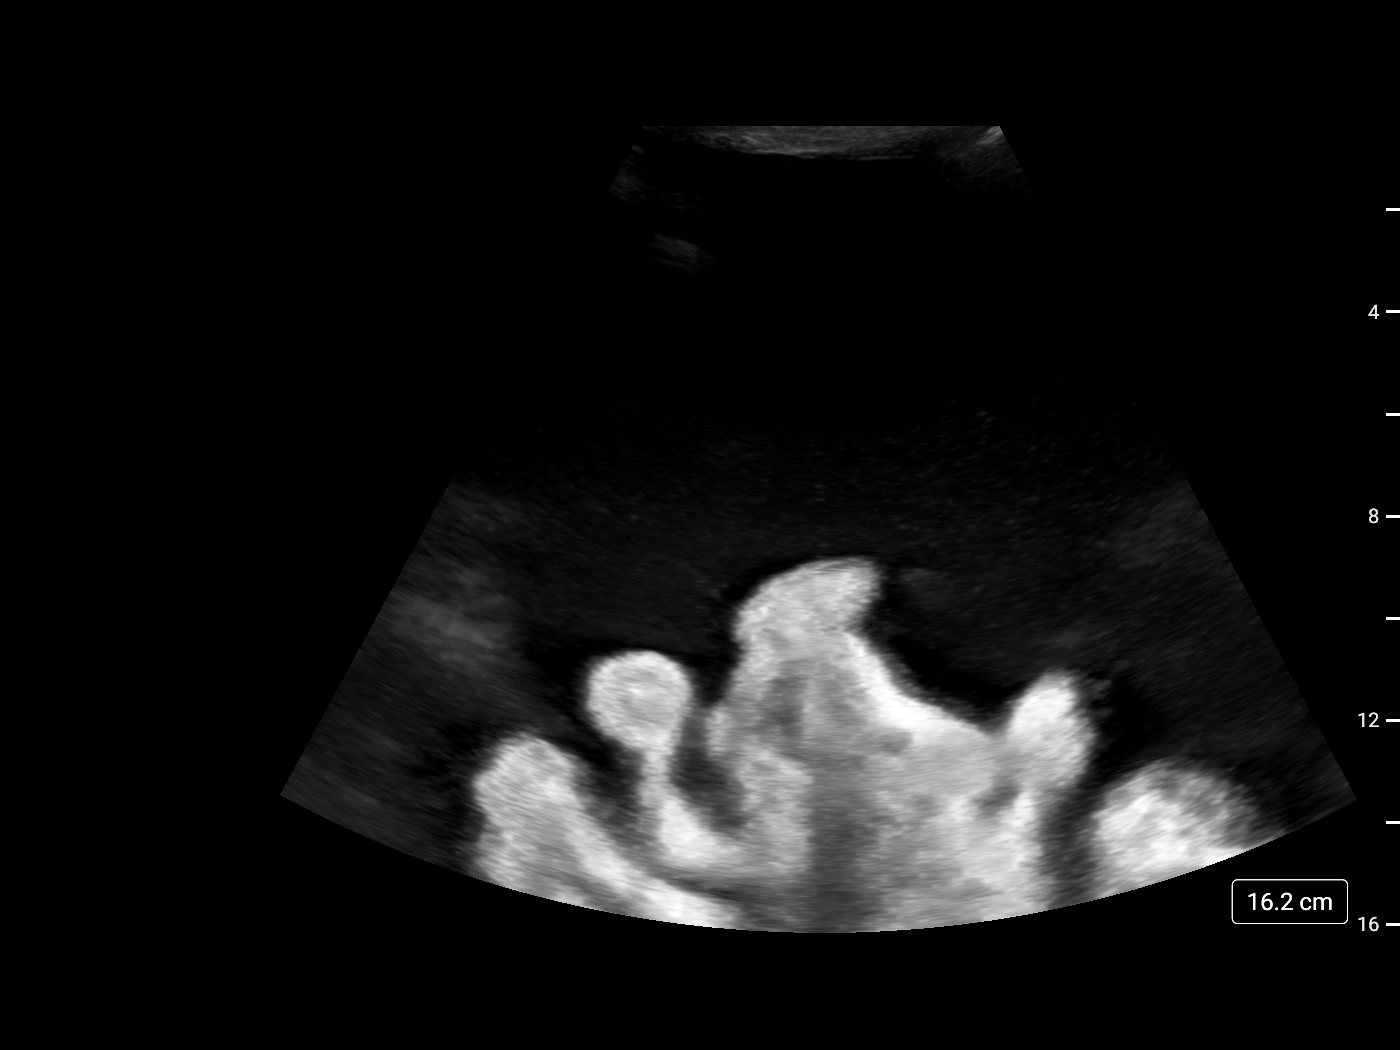
[im 2/2]
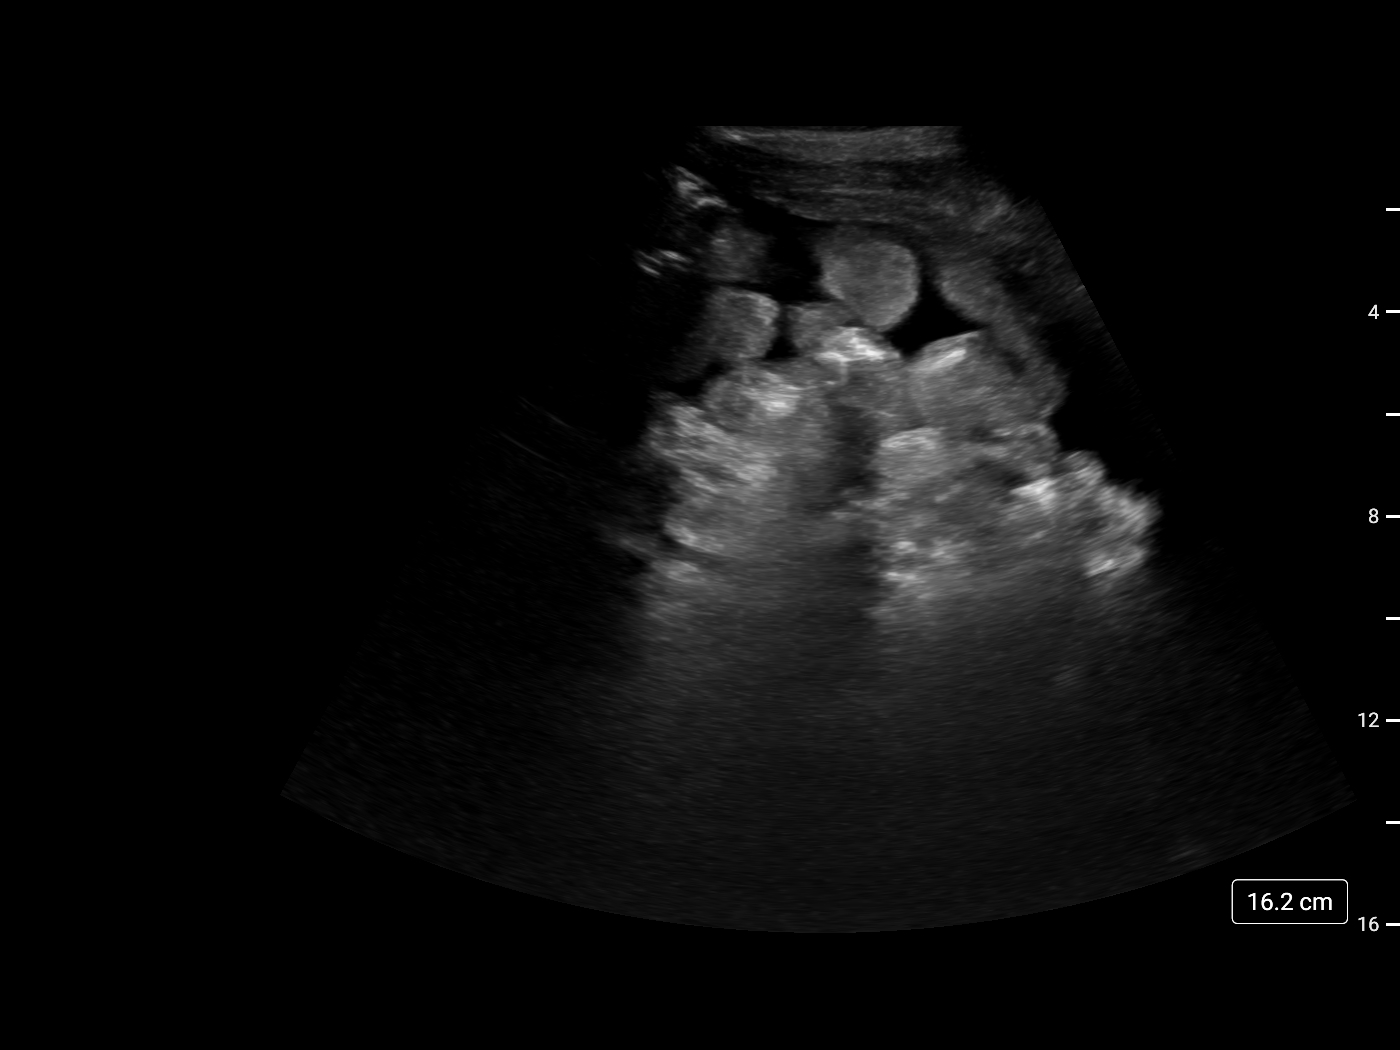

[2 of 2 positions shown; findings below may reference images not displayed]

EXAM:
ULTRASOUND GUIDED DIAGNOSTIC AND THERAPEUTIC PARACENTESIS

MEDICATIONS:
6 mL 1% lidocaine

COMPLICATIONS:
None immediate.

PROCEDURE:
Informed written consent was obtained from the patient after a
discussion of the risks, benefits and alternatives to treatment. A
timeout was performed prior to the initiation of the procedure.

Initial ultrasound scanning demonstrates a large amount of ascites
within the right lower abdominal quadrant. The right lower abdomen
was prepped and draped in the usual sterile fashion. 1% lidocaine
was used for local anesthesia.

Following this, a 19 gauge, 7-cm, Yueh catheter was introduced. An
ultrasound image was saved for documentation purposes. The
paracentesis was performed. The catheter was removed and a dressing
was applied. The patient tolerated the procedure well without
immediate post procedural complication.
Patient received post-procedure intravenous albumin; see nursing
notes for details.
FINDINGS: A total of approximately 8.0 L of clear, yellow fluid was removed.
Samples were sent to the laboratory as requested by the clinical
team.
IMPRESSION: Successful ultrasound-guided paracentesis yielding 8.0 liters of
peritoneal fluid.

Read by Kamata, Leonilde Gongo

## 2021-05-13 IMAGING — US US HEPATIC LIVER DOPPLER
1 series · 14 of 25 positions shown · non-contrast
Comparison: 06/10/2019

CLINICAL DATA: Recent TIPS, persistent ascites

EXAM:
DUPLEX ULTRASOUND OF LIVER AND TIPS SHUNT
TECHNIQUE: Color and duplex Doppler ultrasound was performed to evaluate the
hepatic in-flow and out-flow vessels.

[Series 1: us hepatic liver doppler · 14 of 79 slices shown]
[im 1/79]
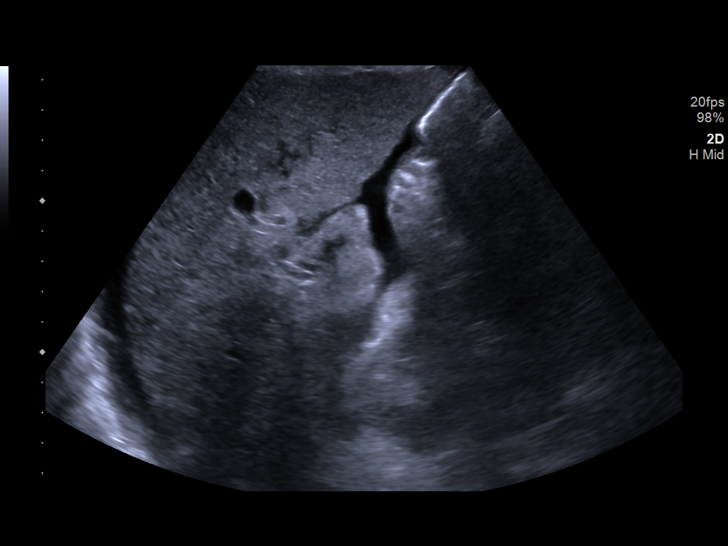
[im 7/79]
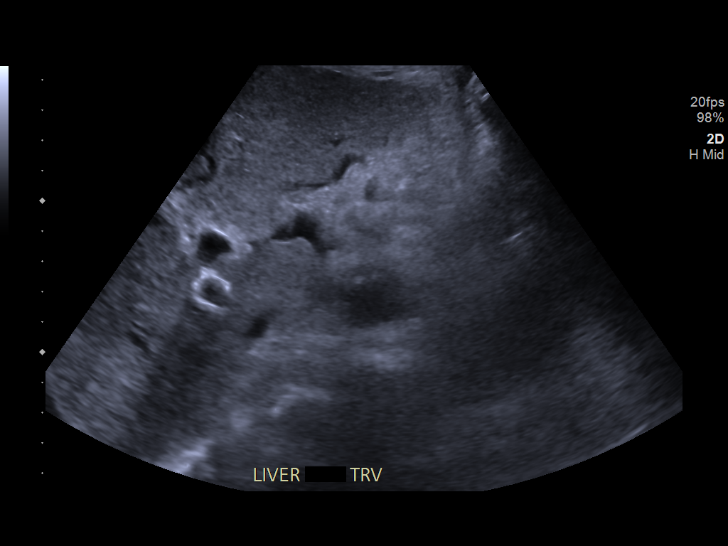
[im 14/79]
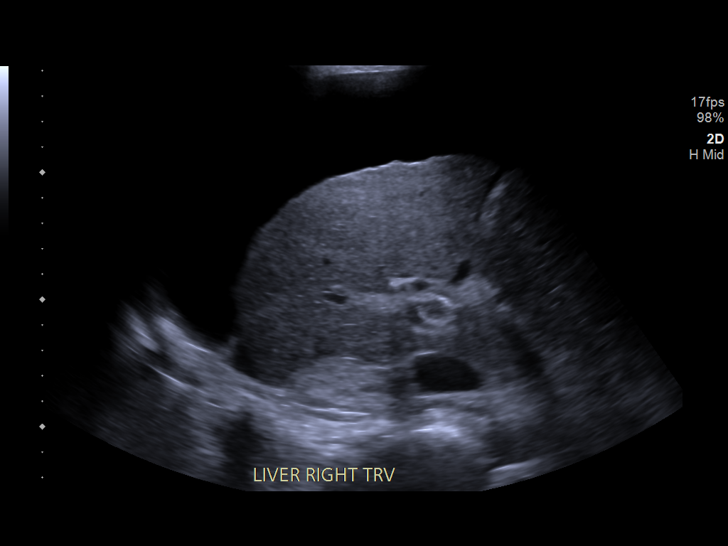
[im 20/79]
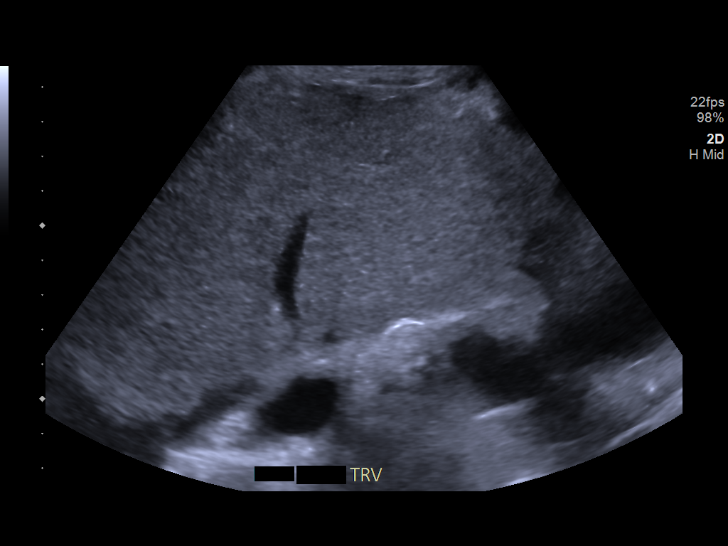
[im 27/79]
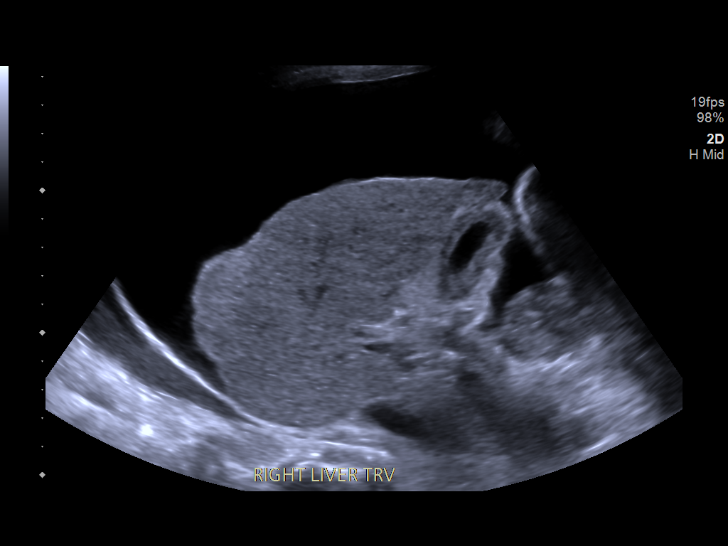
[im 30/79]
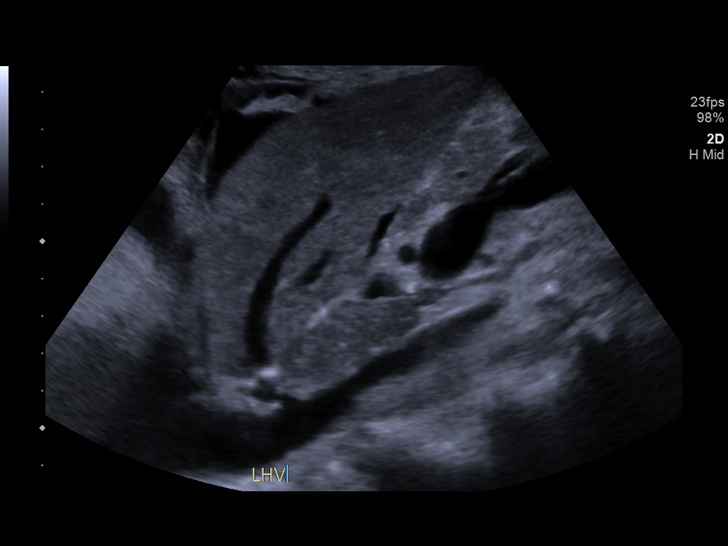
[im 36/79]
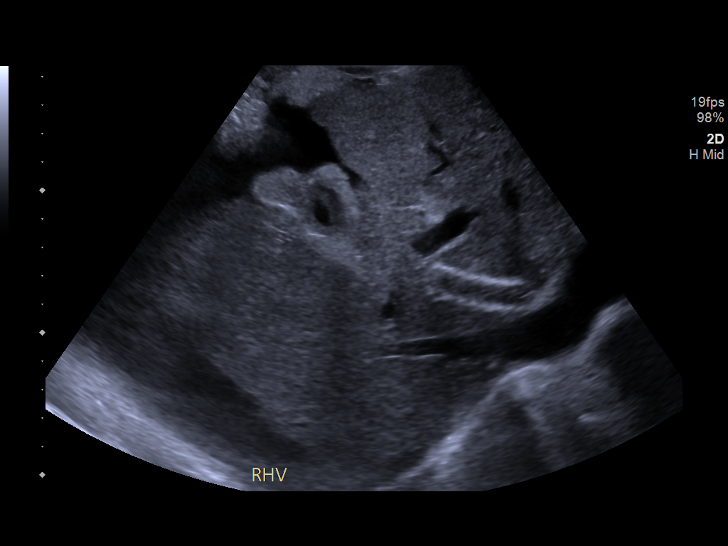
[im 43/79]
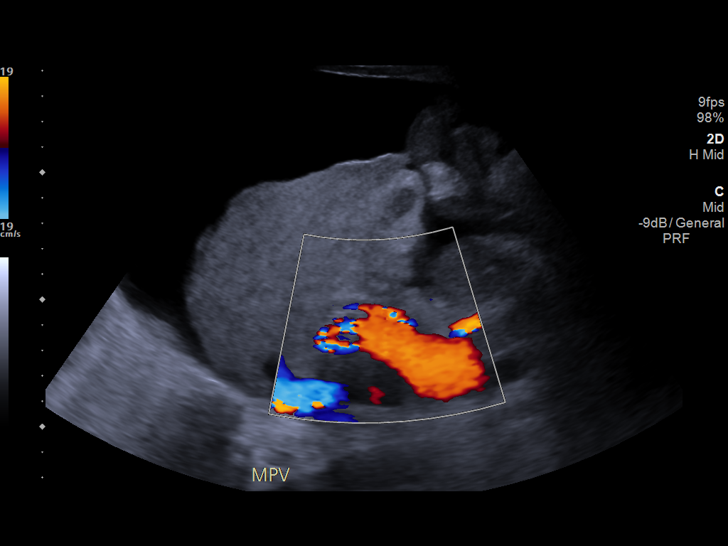
[im 49/79]
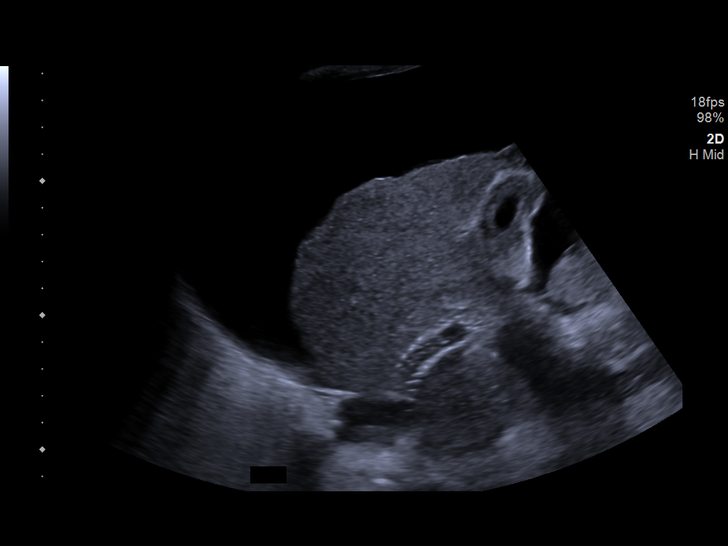
[im 53/79]
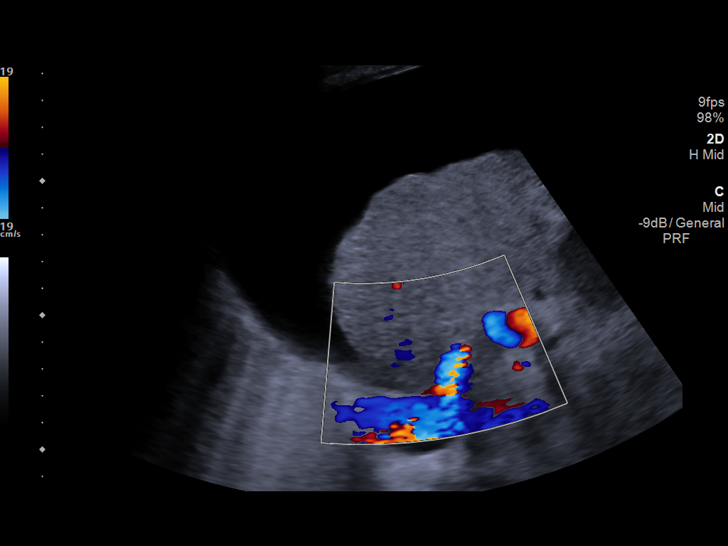
[im 59/79]
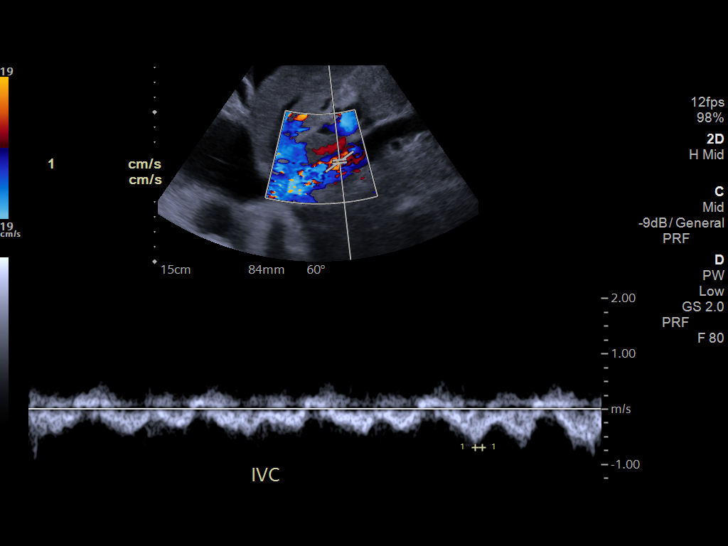
[im 66/79]
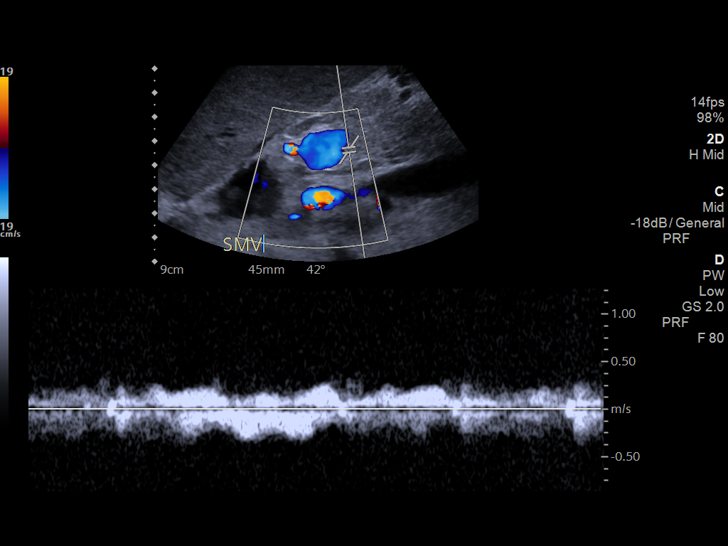
[im 72/79]
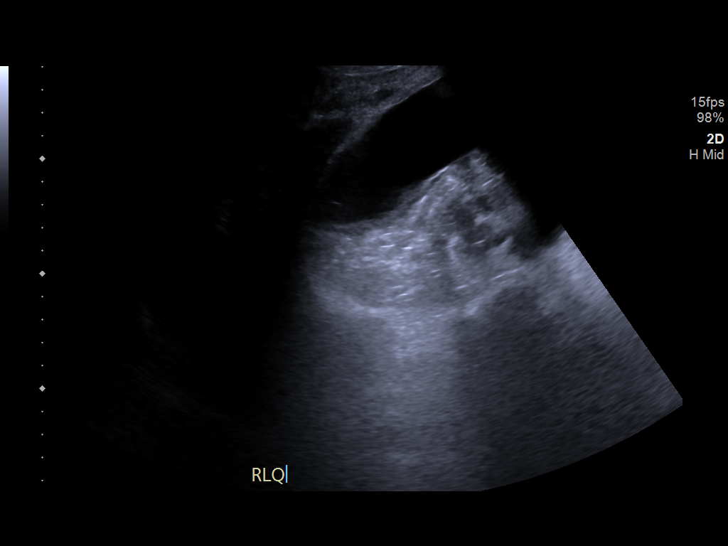
[im 79/79]
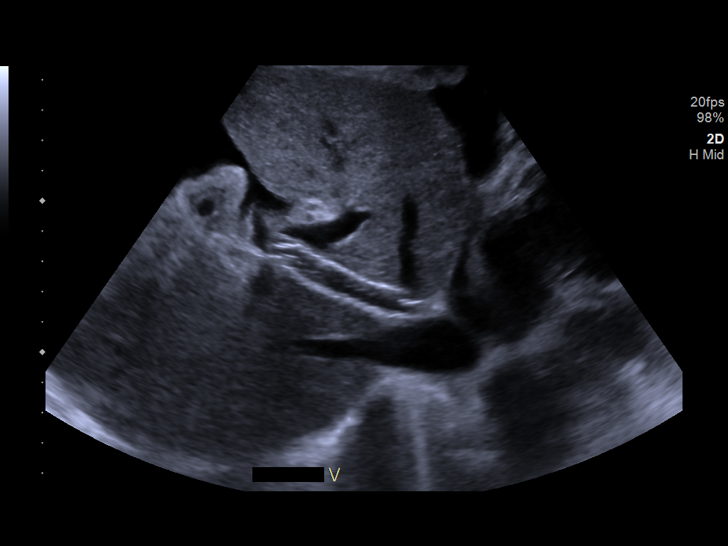

[14 of 25 positions shown; findings below may reference images not displayed]

FINDINGS: TIPS Shunt Velocities

Proximal:  188 cm/sec

Mid:  172

Distal:  200 cm/sec

Portal Vein Velocities

Main:  55 cm/sec

Right:  53 cm/sec

Left:  Not visualized

Hepatic Vein Velocities

Right:  44 cm/sec

Middle:  The TIPS is visualized within the middle hepatic vein

Left:  56 cm/sec

Hepatic Artery Velocity

Splenic Vein Velocity: 39 cm per second

SMV: 34 centimeter/second

Varices:  Difficult to assess

Ascites: Moderate volume of abdominopelvic ascites

Other findings: Bilateral pleural effusions.  IVC is patent.
IMPRESSION: Patent TIPS extending from the portal vein into the middle hepatic
vein with normal directional flow. No significant velocity
abnormality. Other hepatic and portal vein branches all remain
patent with normal directional flow.

Moderate volume of ascites

Bilateral pleural effusions

No Izhab process
